# Patient Record
Sex: Male | Born: 1965 | Race: White | Hispanic: No | Marital: Single
Health system: Southern US, Community
[De-identification: ages and names within clinical notes are randomized; demographics above are authoritative.]

## PROBLEM LIST (undated history)

## (undated) DIAGNOSIS — I10 Essential (primary) hypertension: Secondary | ICD-10-CM

## (undated) DIAGNOSIS — F419 Anxiety disorder, unspecified: Secondary | ICD-10-CM

## (undated) DIAGNOSIS — K219 Gastro-esophageal reflux disease without esophagitis: Secondary | ICD-10-CM

## (undated) DIAGNOSIS — J301 Allergic rhinitis due to pollen: Secondary | ICD-10-CM

## (undated) DIAGNOSIS — E785 Hyperlipidemia, unspecified: Secondary | ICD-10-CM

## (undated) DIAGNOSIS — Z9109 Other allergy status, other than to drugs and biological substances: Secondary | ICD-10-CM

## (undated) HISTORY — DX: Allergic rhinitis due to pollen: J30.1

## (undated) HISTORY — DX: Other allergy status, other than to drugs and biological substances: Z91.09

## (undated) HISTORY — DX: Essential (primary) hypertension: I10

## (undated) HISTORY — PX: OTHER SURGICAL HISTORY: SHX169

## (undated) HISTORY — DX: Anxiety disorder, unspecified: F41.9

## (undated) HISTORY — DX: Gastro-esophageal reflux disease without esophagitis: K21.9

## (undated) HISTORY — DX: Hyperlipidemia, unspecified: E78.5

---

## 2014-10-03 ENCOUNTER — Ambulatory Visit: Payer: Self-pay | Admitting: Physician Assistant

## 2014-10-03 DIAGNOSIS — Z0289 Encounter for other administrative examinations: Secondary | ICD-10-CM

## 2014-10-11 ENCOUNTER — Telehealth: Payer: Self-pay | Admitting: *Deleted

## 2014-10-11 NOTE — Telephone Encounter (Signed)
[facility] JG//CMA

## 2014-10-17 ENCOUNTER — Ambulatory Visit (INDEPENDENT_AMBULATORY_CARE_PROVIDER_SITE_OTHER): Payer: Medicare Other | Admitting: Physician Assistant

## 2014-10-17 ENCOUNTER — Encounter: Payer: Self-pay | Admitting: Physician Assistant

## 2014-10-17 VITALS — BP 142/118 | HR 79 | Temp 98.0°F | Resp 16 | Ht 67.0 in | Wt 242.0 lb

## 2014-10-17 DIAGNOSIS — K2100 Gastro-esophageal reflux disease with esophagitis, without bleeding: Secondary | ICD-10-CM

## 2014-10-17 DIAGNOSIS — IMO0001 Reserved for inherently not codable concepts without codable children: Secondary | ICD-10-CM

## 2014-10-17 DIAGNOSIS — R030 Elevated blood-pressure reading, without diagnosis of hypertension: Secondary | ICD-10-CM

## 2014-10-17 DIAGNOSIS — E785 Hyperlipidemia, unspecified: Secondary | ICD-10-CM

## 2014-10-17 DIAGNOSIS — F411 Generalized anxiety disorder: Secondary | ICD-10-CM

## 2014-10-17 MED ORDER — ESOMEPRAZOLE MAGNESIUM 20 MG PO CPDR: 20.0000 mg | DELAYED_RELEASE_CAPSULE | Freq: Every day | ORAL | Status: DC

## 2014-10-17 MED ORDER — ATORVASTATIN CALCIUM 20 MG PO TABS: 20.0000 mg | ORAL_TABLET | Freq: Every day | ORAL | Status: DC

## 2014-10-17 NOTE — Progress Notes (Signed)
Patient presents to clinic today to establish care.  Acute Concerns: No acute concerns at today's visit.  Chronic Issues: Hyperlipidemia -- Patient currently on Lipitor 20 mg.  Denies myalgias/arthralgias.  Is trying to watch his diet and exercise more.  GERD -- Good control on current regimen.  Will need refill of medication.   Anxiety/Depression -- Currently controlled with Combination of Xanax and Citalopram.  Denies depressed mood on anxiety.   Health Maintenance: Dental -- up-to-date Vision -- up-to-date Immunizations -- Allergic to Influenza; Tetanus in 2013.  Past Medical History  Diagnosis Date  . Anxiety   . HTN (hypertension), benign   . Hyperlipidemia   . GERD (gastroesophageal reflux disease)   . Hay fever   . Environmental allergies     Past Surgical History  Procedure Laterality Date  . Unremarkable      No current outpatient prescriptions on file prior to visit.   No current facility-administered medications on file prior to visit.    Allergies  Allergen Reactions  . Influenza Vaccines Anaphylaxis  . Penicillins     Family History  Problem Relation Age of Onset  . Arthritis Mother     Living  . Hypertension Mother   . Hypertension Father   . Diabetes Father   . Colon cancer Father [id number]    Deceased  . Heart disease Other     Grandparent  . Stroke Other     Grandparent  . Prostate cancer Other     Uncle  . Sudden death Other     Aunt & Uncle    History   Social History  . Marital Status: Single    Spouse Name: N/A    Number of Children: N/A  . Years of Education: N/A   Occupational History  . Not on file.   Social History Main Topics  . Smoking status: Current Every Day Smoker  . Smokeless tobacco: Not on file  . Alcohol Use: Not on file  . Drug Use: Not on file  . Sexual Activity: Not on file   Other Topics Concern  . Not on file   Social History Narrative  . No narrative on file   ROS See HPI.  All other ROS are  negative.  BP 142/118  Pulse 79  Temp(Src) 98 F (36.7 C) (Oral)  Resp 16  Ht 5\' 7"  (1.702 m)  Wt 242 lb (109.77 kg)  BMI 37.89 kg/m2  SpO2 99%  Physical Exam  Vitals reviewed. Constitutional: He is oriented to person, place, and time and well-developed, well-nourished, and in no distress.  HENT:  Head: Normocephalic and atraumatic.  Eyes: Conjunctivae are normal.  Neck: Neck supple. No thyromegaly present.  Cardiovascular: Normal rate, regular rhythm, normal heart sounds and intact distal pulses.   Pulmonary/Chest: Effort normal and breath sounds normal. No respiratory distress. He has no wheezes. He has no rales. He exhibits no tenderness.  Lymphadenopathy:    He has no cervical adenopathy.  Neurological: He is alert and oriented to person, place, and time.  Skin: Skin is warm and dry.  Psychiatric: Affect normal.   Assessment/Plan: Gastroesophageal reflux disease with esophagitis Doing well on current regimen.  Medication refilled.   Hyperlipidemia Continue current regimen for now.  Encouraged increase CV exercise.  Patient to return for CPE with fasting labs.  Will obtain lipid panel and liver function testing at that time.  GAD (generalized anxiety disorder) Instructed patient that 5 mg of Xanax prescribed by previous PCP is greater  than the maximum safe dose.  Will continue Celexa and decrease Xanax to QID dosing.  Will have patient sign CSC and obtain UDS at next visit for CPE.  Elevated BP Asymptomatic.  Patient denies history of HTN.  DASH handout given. Follow-up within 1 month for CPE.  Will recheck BP at that time.

## 2014-10-17 NOTE — Patient Instructions (Addendum)
Please continue medications as directed.  For the anxiety, we will no be prescribing the Xanax more than 4 times daily as your current dose exceeds the maxiumum safe daily dosing.  You will either need to let us add a different medication or set you up with a psychiatrist in the area.  I recommend you schedule an appointment for a Complete Physical.  DASH Eating Plan DASH stands for "Dietary Approaches to Stop Hypertension." The DASH eating plan is a healthy eating plan that has been shown to reduce high blood pressure (hypertension). Additional health benefits may include reducing the risk of type 2 diabetes mellitus, heart disease, and stroke. The DASH eating plan may also help with weight loss. WHAT DO I NEED TO KNOW ABOUT THE DASH EATING PLAN? For the DASH eating plan, you will follow these general guidelines:  Choose foods with a percent daily value for sodium of less than 5% (as listed on the food label).  Use salt-free seasonings or herbs instead of table salt or sea salt.  Check with your health care provider or pharmacist before using salt substitutes.  Eat lower-sodium products, often labeled as "lower sodium" or "no salt added."  Eat fresh foods.  Eat more vegetables, fruits, and low-fat dairy products.  Choose whole grains. Look for the word "whole" as the first word in the ingredient list.  Choose fish and skinless chicken or Mal[place]key more often than red meat. Limit fish, poultry, and meat to 6 oz (170 g) each day.  Limit sweets, desserts, sugars, and sugary drinks.  Choose heart-healthy fats.  Limit cheese to 1 oz (28 g) per day.  Eat more home-cooked food and less restaurant, buffet, and fast food.  Limit fried foods.  Cook foods using methods other than frying.  Limit canned vegetables. If you do use them, rinse them well to decrease the sodium.  When eating at a restaurant, ask that your food be prepared with less salt, or no salt if possible. WHAT FOODS CAN I  EAT? Seek help from a dietitian for individual calorie needs. Grains Whole grain or whole wheat bread. Brown rice. Whole grain or whole wheat pasta. Quinoa, bulgur, and whole grain cereals. Low-sodium cereals. Corn or whole wheat flour tortillas. Whole grain cornbread. Whole grain crackers. Low-sodium crackers. Vegetables Fresh or frozen vegetables (raw, steamed, roasted, or grilled). Low-sodium or reduced-sodium tomato and vegetable juices. Low-sodium or reduced-sodium tomato sauce and paste. Low-sodium or reduced-sodium canned vegetables.  Fruits All fresh, canned (in natural juice), or frozen fruits. Meat and Other Protein Products Ground beef (85% or leaner), grass-fed beef, or beef trimmed of fat. Skinless chicken or [place]key. Ground chicken or [place]urkey. Pork trimmed of fat. All fish and seafood. Eggs. Dried beans, peas, or lentils. Unsalted nuts and seeds. Unsalted canned beans. Dairy Low-fat dairy products, such as skim or 1% milk, 2% or reduced-fat cheeses, low-fat ricotta or cottage cheese, or plain low-fat yogurt. Low-sodium or reduced-sodium cheeses. Fats and Oils Tub margarines without trans fats. Light or reduced-fat mayonnaise and salad dressings (reduced sodium). Avocado. Safflower, olive, or canola oils. Natural peanut or almond butter. Other Unsalted popcorn and pretzels. The items listed above may not be a complete list of recommended foods or beverages. Contact your dietitian for more options. WHAT FOODS ARE NOT RECOMMENDED? Grains White bread. White pasta. White rice. Refined cornbread. Bagels and croissants. Crackers that contain trans fat. Vegetables Creamed or fried vegetables. Vegetables in a cheese sauce. Regular canned vegetables. Regular canned tomato sauce and paste.  Regular tomato and vegetable juices. Fruits Dried fruits. Canned fruit in light or heavy syrup. Fruit juice. Meat and Other Protein Products Fatty cuts of meat. Ribs, chicken wings, bacon, sausage,  bologna, salami, chitterlings, fatback, hot dogs, bratwurst, and packaged luncheon meats. Salted nuts and seeds. Canned beans with salt. Dairy Whole or 2% milk, cream, half-and-half, and cream cheese. Whole-fat or sweetened yogurt. Full-fat cheeses or blue cheese. Nondairy creamers and whipped toppings. Processed cheese, cheese spreads, or cheese curds. Condiments Onion and garlic salt, seasoned salt, table salt, and sea salt. Canned and packaged gravies. Worcestershire sauce. Tartar sauce. Barbecue sauce. Teriyaki sauce. Soy sauce, including reduced sodium. Steak sauce. Fish sauce. Oyster sauce. Cocktail sauce. Horseradish. Ketchup and mustard. Meat flavorings and tenderizers. Bouillon cubes. Hot sauce. Tabasco sauce. Marinades. Taco seasonings. Relishes. Fats and Oils Butter, stick margarine, lard, shortening, ghee, and bacon fat. Coconut, palm kernel, or palm oils. Regular salad dressings. Other Pickles and olives. Salted popcorn and pretzels. The items listed above may not be a complete list of foods and beverages to avoid. Contact your dietitian for more information. WHERE CAN I FIND MORE INFORMATION? National Heart, Lung, and Blood Institute: CablePromo.itwww.nhlbi.nih.gov/health/health-topics/topics/dash/ Document Released: 11/28/2011 Document Revised: 04/25/2014 Document Reviewed: 10/13/2013 [facility] Patient Information 2015 [place] [place]. This information is not intended to replace advice given to you by your health care provider. Make sure you discuss any questions you have with your health care provider.

## 2014-10-17 NOTE — Progress Notes (Signed)
Pre visit review using our clinic review tool, if applicable. No additional management support is needed unless otherwise documented below in the visit note/SLS  

## 2014-10-18 ENCOUNTER — Telehealth: Payer: Self-pay | Admitting: Physician Assistant

## 2014-10-18 DIAGNOSIS — R030 Elevated blood-pressure reading, without diagnosis of hypertension: Secondary | ICD-10-CM | POA: Insufficient documentation

## 2014-10-18 DIAGNOSIS — F411 Generalized anxiety disorder: Secondary | ICD-10-CM | POA: Insufficient documentation

## 2014-10-18 DIAGNOSIS — E785 Hyperlipidemia, unspecified: Secondary | ICD-10-CM | POA: Insufficient documentation

## 2014-10-18 DIAGNOSIS — K2100 Gastro-esophageal reflux disease with esophagitis, without bleeding: Secondary | ICD-10-CM | POA: Insufficient documentation

## 2014-10-18 NOTE — Assessment & Plan Note (Signed)
Instructed patient that 5 mg of Xanax prescribed by previous PCP is greater than the maximum safe dose.  Will continue Celexa and decrease Xanax to QID dosing.  Will have patient sign CSC and obtain UDS at next visit for CPE.

## 2014-10-18 NOTE — Assessment & Plan Note (Signed)
Doing well on current regimen.  Medication refilled.

## 2014-10-18 NOTE — Assessment & Plan Note (Signed)
Continue current regimen for now.  Encouraged increase CV exercise.  Patient to return for CPE with fasting labs.  Will obtain lipid panel and liver function testing at that time.

## 2014-10-18 NOTE — Telephone Encounter (Signed)
emmi mailed  °

## 2014-10-18 NOTE — Assessment & Plan Note (Signed)
Asymptomatic.  Patient denies history of HTN.  DASH handout given. Follow-up within 1 month for CPE.  Will recheck BP at that time.

## 2014-10-27 ENCOUNTER — Telehealth: Payer: Self-pay | Admitting: *Deleted

## 2014-10-27 NOTE — Telephone Encounter (Signed)
[facility] JG//CMA

## 2014-10-31 ENCOUNTER — Other Ambulatory Visit: Payer: Self-pay | Admitting: Physician Assistant

## 2014-10-31 NOTE — Telephone Encounter (Signed)
Caller name: [name] Relation to pt: self Call back number:  [phone] Pharmacy:  [facility] [address]  Reason for call:   Patient requesting another refill of citalopram. Please call patient, he has additional questions regarding this medication.

## 2014-11-01 MED ORDER — PANTOPRAZOLE SODIUM 20 MG PO TBEC: 20.0000 mg | DELAYED_RELEASE_TABLET | Freq: Every day | ORAL | Status: DC

## 2014-11-01 MED ORDER — CITALOPRAM HYDROBROMIDE 40 MG PO TABS: 20.0000 mg | ORAL_TABLET | Freq: Two times a day (BID) | ORAL | Status: AC

## 2014-11-01 NOTE — Telephone Encounter (Signed)
LMOM with contact name and number for return call RE: questions concerning medication; also, to inform that change had to be made with Omeprazole to Pantoprazole d/t possible Cardiac interaction with provider instructions/SLS

## 2014-11-01 NOTE — Telephone Encounter (Signed)
Pt returned call

## 2014-11-09 ENCOUNTER — Telehealth: Payer: Self-pay

## 2014-11-09 ENCOUNTER — Telehealth: Payer: Self-pay | Admitting: Physician Assistant

## 2014-11-09 DIAGNOSIS — E785 Hyperlipidemia, unspecified: Secondary | ICD-10-CM

## 2014-11-09 MED ORDER — ATORVASTATIN CALCIUM 20 MG PO TABS: 20.0000 mg | ORAL_TABLET | Freq: Every day | ORAL | Status: AC

## 2014-11-09 MED ORDER — ALPRAZOLAM 1 MG PO TABS: 1.0000 mg | ORAL_TABLET | Freq: Four times a day (QID) | ORAL | Status: DC | PRN

## 2014-11-09 NOTE — Telephone Encounter (Signed)
rx faxed to pts pharmacy . Pt scheduled for follow-up 11/11/14

## 2014-11-09 NOTE — Telephone Encounter (Signed)
See open phone note  

## 2014-11-09 NOTE — Telephone Encounter (Signed)
rx refill- Xanax 1mg   Last OV- 10/17/14 Last refilled- historical provider  Last UDS- none

## 2014-11-09 NOTE — Telephone Encounter (Signed)
No Xanx until follow-up and UDS.

## 2014-11-09 NOTE — Telephone Encounter (Signed)
Caller name: deborah Relation to pt: sister Call back number: [phone] [facility] [address]  Reason for call:   Patient sister wanting a refill of xanax to be sent to pharmacy. Patient sister states that they will be trying to find a new provider through social services but need refill to until he see's someone else  Patient also states that he needs a refill of nexium

## 2014-11-09 NOTE — Telephone Encounter (Signed)
Patient would like to know if his Xanax can be filled without him returning to the office for his recommended CPE. Advised that his appt was to establish care and that he would need the physical and labs to continue receiving his medications. States that he cannot afford a visit due to his medicaid not paying at this time. Patient wanted to insure that he would be able to get his Xanax when he needed it on the 26th. The UDS and Contract are not yet in EMR.  Please advise your recommendations.

## 2014-11-09 NOTE — Telephone Encounter (Signed)
Caller name:Cimino, Dola ArgyleJames R Relation to VW:UJWJpt:self  Call back number: [phone] [phone]  Reason for call:  Pt requesting a refill ALPRAZolam (XANAX) 1 MG tablet & atorvastatin (LIPITOR) 20 MG tablet.

## 2014-11-09 NOTE — Telephone Encounter (Signed)
Patient was instructed to follow-up so we could reassess.  He was also supposed to have a UDS and sign a CSC at that visit.  No full refill will be given without follow-up, CSC signature and UDS.  I will send in a limited supply of medication for him to take until he returns to clinic.  Patient switched from Nexium to Protonix due to adverse reaction between Nexium and Celexa.

## 2014-11-10 ENCOUNTER — Telehealth: Payer: Self-pay | Admitting: Physician Assistant

## 2014-11-10 NOTE — Telephone Encounter (Signed)
Spoke with pts sister. Done.

## 2014-11-10 NOTE — Telephone Encounter (Signed)
Caller name:Bartle, Dola ArgyleJames R Relation to ZO:XWRUpt:self  Call back [phone]   Reason for call:   Pt wanted to inform you he would like to continue taking the Nexium

## 2014-11-10 NOTE — Telephone Encounter (Signed)
Done . pts sister notified

## 2014-11-11 ENCOUNTER — Encounter: Payer: Self-pay | Admitting: Physician Assistant

## 2014-11-11 ENCOUNTER — Ambulatory Visit (INDEPENDENT_AMBULATORY_CARE_PROVIDER_SITE_OTHER): Payer: Medicare Other | Admitting: Physician Assistant

## 2014-11-11 VITALS — BP 136/86 | HR 77 | Temp 98.1°F | Resp 16

## 2014-11-11 DIAGNOSIS — F411 Generalized anxiety disorder: Secondary | ICD-10-CM

## 2014-11-11 NOTE — Telephone Encounter (Signed)
Will discuss at his visit

## 2014-11-11 NOTE — Progress Notes (Signed)
Pre visit review using our clinic review tool, if applicable. No additional management support is needed unless otherwise documented below in the visit note. 

## 2014-11-14 ENCOUNTER — Encounter: Payer: Medicare Other | Admitting: Physician Assistant

## 2014-11-14 ENCOUNTER — Other Ambulatory Visit: Payer: Self-pay | Admitting: Physician Assistant

## 2014-11-14 NOTE — Telephone Encounter (Signed)
Caller name: Trevor Norris Relation to pt: sister Call back number: [phone] or [phone] Pharmacy:   Reason for call:   Trevor Belfasteborah whitley, patient sister [facility]. She states that he only received partial rx???

## 2014-11-14 NOTE — Assessment & Plan Note (Signed)
Doing well with TID Xanax and continuing Citalopram 40 mg daily. Nexium discontinued due to adverse reactions with high dose of Citalopram.  Rx Protonix instead.  CSC signed and UDS obtained today.  Follow-up will be based on UDS results.

## 2014-11-14 NOTE — Telephone Encounter (Signed)
He only received a partial prescription (60 tablets) because he was overdue for submitting a UDS and signing a controlled substance contract, things that were discussed with both the patient's sister and patient several times, including at his visit last week.  Patient gave UDS at visit on Friday so once I have the results, he can receive a full Rx.  I suggest the patient's sister communicate more with the patient and vice versa so there is not confusion about the plan.

## 2014-11-14 NOTE — Progress Notes (Signed)
Patient presents to clinic today to discuss medications for anxiety.  Patient had requested refill of Xanax previously given by last PMD.  Patient was given partial fill of medication with new dosing instructions (will note write Xanax for 5 times daily).  Patient is here to sign his CSC and provide a sample for a urine drug screen so that he can be eligible to receive future refills.  Patient wishes to discuss Xanax dosing further.  Previously was very distraught once he learned we would not be allowing five times daily dosing of his 1 mg Xanax tablets. Patient states he has actually been doing very well on Xanax up to 3 times daily.  Denies panic attacks.  Is trying to stay active to help relieve stress and prevent anxiety.  Past Medical History  Diagnosis Date  . Anxiety   . HTN (hypertension), benign   . Hyperlipidemia   . GERD (gastroesophageal reflux disease)   . Hay fever   . Environmental allergies     Current Outpatient Prescriptions on File Prior to Visit  Medication Sig Dispense Refill  . acetaminophen (TYLENOL) 500 MG tablet Take 1,000 mg by mouth every 6 (six) hours as needed.    . ALPRAZolam (XANAX) 1 MG tablet Take 1 tablet (1 mg total) by mouth 4 (four) times daily as needed for anxiety. 60 tablet 0  . atorvastatin (LIPITOR) 20 MG tablet Take 1 tablet (20 mg total) by mouth daily. 30 tablet 6  . citalopram (CELEXA) 40 MG tablet Take 0.5 tablets (20 mg total) by mouth 2 (two) times daily. 60 tablet 1  . diphenhydrAMINE (BENADRYL) 50 MG capsule Take 50 mg by mouth every 6 (six) hours as needed (Takres 3-5 capsules daily).    . mometasone (NASONEX) 50 MCG/ACT nasal spray Place 2 sprays into the nose daily.    . pantoprazole (PROTONIX) 20 MG tablet Take 1 tablet (20 mg total) by mouth daily. 30 tablet 1  . simethicone (MYLICON) 80 MG chewable tablet Chew 80 mg by mouth 3 (three) times daily as needed for flatulence.     No current facility-administered medications on file  prior to visit.    Allergies  Allergen Reactions  . Influenza Vaccines Anaphylaxis  . Penicillins     Family History  Problem Relation Age of Onset  . Arthritis Mother     Living  . Hypertension Mother   . Hypertension Father   . Diabetes Father   . Colon cancer Father [id number]    Deceased  . Heart disease Other     Grandparent  . Stroke Other     Grandparent  . Prostate cancer Other     Uncle  . Sudden death Other     Aunt & Uncle    History   Social History  . Marital Status: Single    Spouse Name: N/A    Number of Children: N/A  . Years of Education: N/A   Social History Main Topics  . Smoking status: Current Every Day Smoker  . Smokeless tobacco: None  . Alcohol Use: None  . Drug Use: None  . Sexual Activity: None   Other Topics Concern  . None   Social History Narrative   Review of Systems - See HPI.  All other ROS are negative.  BP 136/86 mmHg  Pulse 77  Temp(Src) 98.1 F (36.7 C) (Oral)  Resp 16  Wt   SpO2 97%  Physical Exam  Constitutional: He is oriented to person, place,  and time and well-developed, well-nourished, and in no distress.  HENT:  Head: Normocephalic and atraumatic.  Cardiovascular: Normal rate, regular rhythm and normal heart sounds.   Pulmonary/Chest: Breath sounds normal.  Neurological: He is alert and oriented to person, place, and time.  Psychiatric: Affect normal.  Vitals reviewed.  Assessment/Plan: GAD (generalized anxiety disorder) Doing well with TID Xanax and continuing Citalopram 40 mg daily. Nexium discontinued due to adverse reactions with high dose of Citalopram.  Rx Protonix instead.  CSC signed and UDS obtained today.  Follow-up will be based on UDS results.

## 2014-11-15 ENCOUNTER — Telehealth: Payer: Self-pay | Admitting: Physician Assistant

## 2014-11-15 NOTE — Telephone Encounter (Signed)
Pt wanted to touch base in regards to previous conversation regarding medication and would like to apologize for the way he spoke to you.

## 2014-11-15 NOTE — Telephone Encounter (Signed)
Martin's note was reviewed with sister.  Sister stated understanding and stated that she was would review this information with patient.  No further needs voiced at this time.

## 2014-11-15 NOTE — Telephone Encounter (Signed)
Trevor MerlWilliam C Martin, PA-C at 11/14/2014 4:39 PM     Status: Signed       Expand All Collapse All   He only received a partial prescription (60 tablets) because he was overdue for submitting a UDS and signing a controlled substance contract, things that were discussed with both the patient's sister and patient several times, including at his visit last week. Patient gave UDS at visit on Friday so once I have the results, he can receive a full Rx. I suggest the patient's sister communicate more with the patient and vice versa so there is not confusion about the plan.     The above information was discussed with patient.  Pt stated understanding.  No further needs at this time.

## 2014-11-16 ENCOUNTER — Telehealth: Payer: Self-pay | Admitting: *Deleted

## 2014-11-16 NOTE — Telephone Encounter (Signed)
[facility] JG//CMA

## 2014-11-22 NOTE — Telephone Encounter (Signed)
Patient No Show F/U appt, did not complete mandatory UDS as requested; No refill authorizations will be given per provider/SLS

## 2014-11-23 ENCOUNTER — Telehealth: Payer: Self-pay | Admitting: Physician Assistant

## 2014-11-23 MED ORDER — ALPRAZOLAM 1 MG PO TABS: 1.0000 mg | ORAL_TABLET | Freq: Four times a day (QID) | ORAL | Status: DC | PRN

## 2014-11-23 NOTE — Telephone Encounter (Signed)
Refill granted -- again limited supply.  There will be no more given until UDS is obtained.  He needs to come to lab for UDS.

## 2014-11-23 NOTE — Telephone Encounter (Signed)
Spoke with patient regarding the needed urine sample for UDS screening, in order for us to fulfill his request for complete Rx for his controlled substance. Pt was very accusatory of our employees, stating that "a 48-yr old could do the job and what did we do with his urine sample--throw them out the window" [speaking of our lab staff]; patient was informed that we had not only checked with our lab staff, but also the facility that runs our UDS labs and that neither had ever received a urine sample from this patient [as he states that he gave urine sample at his new patient appt and came back in at another time after we had informed him that not only did we need [facility] signed but he still had to give us a urine sample for mandatory UDS [and at that time nothing was mentioned regarding the sample being and/o not being done at the time of OV]. Pt continued to state that "it is not his responsibility to make sure that the lab receives his urine sample, only his responsibility to leave it"; pt was corrected on this matter and told that it is also his responsibility and that he needed to confirm when he returns that the lab staff receives the urine sample before leaving the office, as per protocol, we will not be able to authorize any further refill authorizations without those results. Spoke with pharmacy regarding patient's Rx, verified they received fax; but was informed that it was too soon to fill the Rx. Provider checked [place] database and found that patient filled Rx written from prior physician and that he is not due for refill until 12.09.15; LMOVM for patient to inform him of this and that provider has given him a supply of medication that has been faxed & received by pharmacy and when it will be available for fill/SLS

## 2014-11-23 NOTE — Telephone Encounter (Signed)
Spoke with patient's sister concerning situation as [facility]  She informs understanding.  Will speak with her brother to have him come give UDS.  I do think the patient is just having a hard time with instructions giving his Autism.  Lab has been made aware of patient's "needs" and will make sure UDS is obtained when he returns to office.  They are to inform me when he has come in for this.

## 2014-11-23 NOTE — Telephone Encounter (Signed)
Per our Lab, patient has NOT gave a urine sample for UDS/SLS Please Advise.

## 2014-11-23 NOTE — Telephone Encounter (Signed)
Caller name: [name] Relation to pt: self Call back number: [phone] Pharmacy:  Reason for call:   Patient is requesting results from urine specimen? So, he can get the other half of the medication

## 2014-11-23 NOTE — Telephone Encounter (Signed)
Patient again needs to come give a urine sample for a drug screen.  This has been iterated several times to the patient and his sister.  No additional refills until he gives sample. Please inform patient and sister so she can help him remember to come give sample.

## 2014-11-24 ENCOUNTER — Telehealth: Payer: Self-pay

## 2014-11-24 NOTE — Telephone Encounter (Signed)
Thank you for making me aware of his appointment.  I will mark is in my reminders so that I can speak to him.

## 2014-11-24 NOTE — Telephone Encounter (Signed)
Annamaria HellingJames Schoof [phone] [facility](M) [phone] [facility](H)  [name] called to say he is coming in on Monday at 1:15 to do the urine drug screen for the third time and he stated he was going to put speciman in your hand. I told him the best thing to do is give to them in the lab.

## 2014-11-28 ENCOUNTER — Other Ambulatory Visit: Payer: Medicare Other

## 2014-11-29 ENCOUNTER — Telehealth: Payer: Self-pay | Admitting: Physician Assistant

## 2014-11-29 ENCOUNTER — Other Ambulatory Visit: Payer: Self-pay | Admitting: Physician Assistant

## 2014-11-29 ENCOUNTER — Encounter: Payer: Self-pay | Admitting: Physician Assistant

## 2014-11-29 NOTE — Telephone Encounter (Signed)
Attempted to reach patient and his [facility]  Could not reach either. LMOM (sister) to let he know patient no-showed for lab appointment to give UDS.

## 2014-11-29 NOTE — Telephone Encounter (Signed)
Patient came in to office today to give UDS.  UDS sample obtained and note written as proof of patient's visit.

## 2014-12-05 ENCOUNTER — Other Ambulatory Visit: Payer: Self-pay | Admitting: Physician Assistant

## 2014-12-05 NOTE — Telephone Encounter (Addendum)
Medication   esomeprazole (NEXIUM) 20 MG capsule [[id number]]       esomeprazole (NEXIUM) 20 MG capsule [[id number]][[id number]] DISCONTINUED     Order Details    Dose: 20 mg Route: Oral Frequency: Daily   Dispense Quantity:  30 capsule Refills:  0 Fills Remaining:  0          Sig: Take 1 capsule (20 mg total) by mouth daily at 12 noon.         Discontinue Date:  11/01/2014 [id number] Discontinue User:  Regis BillSharon L Camerin Jimenez, CMA Discontinue Reason:  Side effect (s)   Written Date:  10/17/14 Expiration Date:  10/17/15     Start Date:  10/17/14 End Date:  11/01/14     Ordering Provider:  -- Authorizing Provider:  Waldon MerlWilliam C Martin, PA-C Ordering User:  Waldon MerlWilliam C Martin, PA-C          Medication Notes      Regis BillSharon L Janete Quilling, CMA -- 11/01/14 9:58 AM      Possible Drug Interaction, change per VO provider/SLS      Diagnosis Association: Gastroesophageal reflux disease with esophagitis (530.11)              Medication Detail      Disp Refills Start End     ALPRAZolam (XANAX) 1 MG tablet 60 tablet 0 11/23/2014     Sig - Route: Take 1 tablet (1 mg total) by mouth 4 (four) times daily as needed for anxiety. - Oral    Class: Print    Notes to Pharmacy: Limites supply given. No refills without appointment and urine drug screen    Hold for Urine Drug Screening Results per provider/SLS

## 2014-12-08 ENCOUNTER — Other Ambulatory Visit: Payer: Self-pay

## 2014-12-08 ENCOUNTER — Telehealth: Payer: Self-pay | Admitting: Physician Assistant

## 2014-12-08 ENCOUNTER — Other Ambulatory Visit: Payer: Self-pay | Admitting: Physician Assistant

## 2014-12-08 MED ORDER — PANTOPRAZOLE SODIUM 20 MG PO TBEC: 20.0000 mg | DELAYED_RELEASE_TABLET | Freq: Every day | ORAL | Status: DC

## 2014-12-08 NOTE — Telephone Encounter (Signed)
Phoned in Alprazolam rx to pharmacy #120 0 refills. LMOM informing Pt he should NOT be taking Nexium due to reactions with Celexa, he should be taking Protonix. Also informed him urine results were not back as of yet and will give him a call as soon as they do.

## 2014-12-08 NOTE — Telephone Encounter (Signed)
Please advise 

## 2014-12-08 NOTE — Telephone Encounter (Signed)
Caller name: [name] Relation to pt: self Call back number: [phone] Pharmacy[facility] [address]  Reason for call:   Patient is requesting urinalysis results.   Patient is also requesting a nexium and alprazolam(another 15 days?) refill  Please call patient after lunch.

## 2014-12-08 NOTE — Telephone Encounter (Signed)
Med filled.  

## 2014-12-08 NOTE — Telephone Encounter (Signed)
Cannot have Nexium and discussed several times because at his dose of Celexa, there can be problems mixing it with Nexium.  He was switched to Protonix.  Ok to refill that prescription.  Still waiting on urine results but I am ok giving him an entire month supply of Xanax while we are waiting.  He takes 1 tablet up to QID.  Phone in Rx with Quantity 120.

## 2014-12-09 ENCOUNTER — Telehealth: Payer: Self-pay | Admitting: Physician Assistant

## 2014-12-09 MED ORDER — LANSOPRAZOLE 15 MG PO TBDP: 15.0000 mg | ORAL_TABLET | Freq: Every day | ORAL | Status: DC

## 2014-12-09 NOTE — Telephone Encounter (Signed)
Med filled.  

## 2014-12-09 NOTE — Telephone Encounter (Signed)
Please can you complete the PA for Prevacid or Protonix.

## 2014-12-09 NOTE — Telephone Encounter (Signed)
Caller name: London SheerChimber, Grae R Relation to pt: self  Call back number: [phone] Pharmacy:  Reason for call:  Pt states insurance will not cover pantoprazole (PROTONIX) 20 MG tablet, pt requesting esomeprazole (NEXIUM) 20 MG capsule in addition pt requesting UA results. Please advise

## 2014-12-09 NOTE — Telephone Encounter (Signed)
Again, UDS looks great.  No concerns.  Will not need repeat for 1 year.  Again, will not give Nexium.  Please call pharmacy to see cost of Nexium. Can attempt Prevacid if  Nexium costly.

## 2014-12-09 NOTE — Telephone Encounter (Signed)
Prilosec carries the same risk associated as Nexium.  Recommend we try to get a PA for the Protonix or we can send in an Rx for Zantac to take twice daily as insurance will likely cover this.

## 2014-12-09 NOTE — Telephone Encounter (Signed)
Spoke with pt pharmacy and they advised that the prevacid is not covered. However they stated that prilosec may be. They would not run it to determine the cost for me.

## 2014-12-09 NOTE — Telephone Encounter (Signed)
Last OV 11-11-14 Alprazolam last filled 11-23-14 #60 with 0  Note says UDS needed.

## 2014-12-09 NOTE — Telephone Encounter (Signed)
UDS results are in and everything looks like it is supposed to.  Will repeat UDS yearly.  Medication was phoned into pharmacy yesterday -- entire month supply.

## 2014-12-09 NOTE — Telephone Encounter (Signed)
Pt.notified

## 2014-12-13 MED ORDER — DEXLANSOPRAZOLE 30 MG PO CPDR: 30.0000 mg | DELAYED_RELEASE_CAPSULE | Freq: Every day | ORAL | Status: DC

## 2014-12-13 NOTE — Addendum Note (Signed)
Addended by: Amado CoeGLOVER, Kristy Catoe A on: 12/13/2014 02:03 PM   Modules accepted: Orders, Medications

## 2014-12-13 NOTE — Telephone Encounter (Signed)
PA denied, alternative is Dexilant. Would this be an acceptable alternative to switch to? Please advise.

## 2014-12-13 NOTE — Telephone Encounter (Signed)
Prevacid solutab discontinued and dexilant 30 mg sent to pharmacy. JG//CMA

## 2014-12-13 NOTE — Telephone Encounter (Signed)
PA initiated. Awaiting determination. JG//CMA 

## 2014-12-13 NOTE — Telephone Encounter (Signed)
Yes, please switch to Dexilant daily.

## 2014-12-14 ENCOUNTER — Telehealth: Payer: Self-pay | Admitting: Physician Assistant

## 2014-12-14 NOTE — Telephone Encounter (Signed)
Patient called in stating that he is very upset that [facility] [address]. Patient gave me the verbal ok to send all of his records. P: [phone] and F: [phone]: [phone]

## 2014-12-14 NOTE — Telephone Encounter (Signed)
Medical record request was received on 11/16/2014 and sent [facility].  Requested medical records faxed to number provided successfully.  JG//CMA

## 2014-12-27 ENCOUNTER — Encounter: Payer: Self-pay | Admitting: Physician Assistant

## 2014-12-29 ENCOUNTER — Telehealth: Payer: Self-pay | Admitting: Physician Assistant

## 2014-12-29 NOTE — Telephone Encounter (Signed)
Caller name: [name] Relation to pt: self Call back number:  [phone] Pharmacy:  Reason for call:   Patient states that [facility] told him that they "could not meet his needs" and would not accept him as a new patient. Patient states that he still wants to stay here as a patient.

## 2015-01-02 MED ORDER — ALPRAZOLAM 1 MG PO TABS: ORAL_TABLET | ORAL | Status: DC

## 2015-01-02 NOTE — Addendum Note (Signed)
Addended by: Regis BillSCATES, Lindzy Rupert L on: 01/02/2015 02:21 PM   Modules accepted: Orders

## 2015-01-02 NOTE — Telephone Encounter (Signed)
Rx request printed for provider signature/SLS

## 2015-01-02 NOTE — Telephone Encounter (Signed)
Will do.  Ok to send in refill of Xanax with same quantity and instructions if due for medication.

## 2015-01-02 NOTE — Telephone Encounter (Signed)
Patient returned phone. I informed him of this and he would like to know if Selena BattenCody would give him one refill to last him until he can find another provider?  Best # [phone] sister, Gavin PoundDeborah

## 2015-01-02 NOTE — Telephone Encounter (Signed)
LMOM with contact name and number [for return call, if needed] RE: Unfortunately patient had to be switched to Medicaid, per his Sister's conversation with Marcelline MatesWilliam Martin, PA-C and we do not accept [place] Access Medicaid via [place], informed pt of this via telephone per provider instructions/SLS

## 2015-01-02 NOTE — Telephone Encounter (Signed)
Rx request faxed to pharmacy; pt can fill on or after 01.18.16 per provider/SLS

## 2015-01-05 NOTE — Telephone Encounter (Signed)
Patient requesting a callback late morning.

## 2015-01-05 NOTE — Telephone Encounter (Signed)
Patient would like to know if Selena BattenCody would refill another month after this past refill. Just in case he cannot find another provider within a month. Best # [phone]

## 2015-01-06 ENCOUNTER — Telehealth: Payer: Self-pay | Admitting: Physician Assistant

## 2015-01-06 NOTE — Telephone Encounter (Signed)
Pt called to follow up on he's medication request. Advised pt of PA instructions, pt voice understanding.Patient stated it was kind of PA to refill he's medication till Feb and if needed for 1 month if PCP is not found.

## 2015-01-06 NOTE — Telephone Encounter (Signed)
Pt called to follow up on he's medication request. Advised pt of PA instructions, pt voice understanding.Patient stated it was kind of PA to refill he's medication till Feb if needed for 1 month if PCP is not found.

## 2015-01-06 NOTE — Telephone Encounter (Signed)
He should be good on medication until February 18th. If not provider by that time I am willing to write one more refill but no other refills will be given.

## 2020-03-30 ENCOUNTER — Other Ambulatory Visit: Payer: Self-pay

## 2020-03-30 ENCOUNTER — Emergency Department (HOSPITAL_COMMUNITY): Payer: Medicare HMO

## 2020-03-30 ENCOUNTER — Telehealth (HOSPITAL_COMMUNITY): Payer: Self-pay | Admitting: Nurse Practitioner

## 2020-03-30 ENCOUNTER — Emergency Department (HOSPITAL_COMMUNITY)
Admission: EM | Admit: 2020-03-30 | Discharge: 2020-03-30 | Disposition: A | Discharge status: Home / Self Care | Payer: Medicare HMO | Attending: Emergency Medicine | Admitting: Emergency Medicine

## 2020-03-30 DIAGNOSIS — U071 COVID-19: Secondary | ICD-10-CM

## 2020-03-30 DIAGNOSIS — Z79899 Other long term (current) drug therapy: Secondary | ICD-10-CM | POA: Diagnosis not present

## 2020-03-30 DIAGNOSIS — R509 Fever, unspecified: Secondary | ICD-10-CM | POA: Diagnosis present

## 2020-03-30 DIAGNOSIS — F419 Anxiety disorder, unspecified: Secondary | ICD-10-CM | POA: Insufficient documentation

## 2020-03-30 DIAGNOSIS — I10 Essential (primary) hypertension: Secondary | ICD-10-CM | POA: Insufficient documentation

## 2020-03-30 DIAGNOSIS — F17210 Nicotine dependence, cigarettes, uncomplicated: Secondary | ICD-10-CM | POA: Insufficient documentation

## 2020-03-30 LAB — POC SARS CORONAVIRUS 2 AG -  ED: SARS Coronavirus 2 Ag: POSITIVE — AB

## 2020-03-30 MED ORDER — DIPHENHYDRAMINE HCL 25 MG PO TABS: 25.0000 mg | ORAL_TABLET | Freq: Three times a day (TID) | ORAL | 0 refills | Status: AC | PRN

## 2020-03-30 MED ORDER — ACETAMINOPHEN 325 MG PO TABS
650.0000 mg | ORAL_TABLET | Freq: Once | ORAL | Status: AC
  Administered 2020-03-30: 10:00:00 650 mg via ORAL
  Filled 2020-03-30: qty 2

## 2020-03-30 MED ORDER — CLONAZEPAM 0.5 MG PO TABS
1.0000 mg | ORAL_TABLET | Freq: Once | ORAL | Status: AC
  Administered 2020-03-30: 1 mg via ORAL
  Filled 2020-03-30: qty 2

## 2020-03-30 NOTE — ED Provider Notes (Signed)
Trevor Norris Provider Note   CSN: [id number] Arrival date & time: 03/30/20  0850     History Chief Complaint  Patient presents with  . Anxiety  . Fever    Trevor Norris is a 54 y.o. male.  HPI Patient presents with fever cough nasal congestion and anxiety.  States he thinks he cough could be coming from the nasal drainage going down his throat.  No known sick contacts.  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  States he wears a mask and rarely goes out.  No loss of taste or smell.  No vomiting diarrhea.  Has occasional nausea also from the drainage down his throat.  No real sore throat.  Patient has a history of anxiety and states he is very anxious this time.    Past Medical History:  Diagnosis Date  . Anxiety   . Environmental allergies   . GERD (gastroesophageal reflux disease)   . Hay fever   . HTN (hypertension), benign   . Hyperlipidemia     Patient Active Problem List   Diagnosis Date Noted  . Hyperlipidemia 10/18/2014  . Gastroesophageal reflux disease with esophagitis 10/18/2014  . GAD (generalized anxiety disorder) 10/18/2014  . Elevated BP 10/18/2014    Past Surgical History:  Procedure Laterality Date  . Unremarkable         Family History  Problem Relation Age of Onset  . Arthritis Mother        Living  . Hypertension Mother   . Hypertension Father   . Diabetes Father   . Colon cancer Father 70       Deceased  . Heart disease Other        Grandparent  . Stroke Other        Grandparent  . Prostate cancer Other        Uncle  . Sudden death Other        Aunt & Uncle    Social History   Tobacco Use  . Smoking status: Current Every Day Smoker  Substance Use Topics  . Alcohol use: Not on file  . Drug use: Not on file    Home Medications Prior to Admission medications   Medication Sig Start Date End Date Taking? Authorizing Provider  acetaminophen (TYLENOL) 500 MG  tablet Take 1,000 mg by mouth every 6 (six) hours as needed.    [provider]  ALPRAZolam Prudy Feeler) 1 MG tablet TAKE 1 TABLET BY MOUTH 4 TIMES A DAY AS NEEDED FOR ANXIETY 01/02/15   Waldon Merl, PA-C  atorvastatin (LIPITOR) 20 MG tablet Take 1 tablet (20 mg total) by mouth daily. 11/09/14   Waldon Merl, PA-C  citalopram (CELEXA) 40 MG tablet Take 0.5 tablets (20 mg total) by mouth 2 (two) times daily. 11/01/14   Waldon Merl, PA-C  Dexlansoprazole (DEXILANT) 30 MG capsule Take 1 capsule (30 mg total) by mouth daily. 12/13/14   Waldon Merl, PA-C  diphenhydrAMINE (BENADRYL) 25 MG tablet Take 1 tablet (25 mg total) by mouth every 8 (eight) hours as needed for allergies. 03/30/20   Benjiman Core, MD  mometasone (NASONEX) 50 MCG/ACT nasal spray Place 2 sprays into the nose daily.    [provider]  pantoprazole (PROTONIX) 20 MG tablet Take 1 tablet (20 mg total) by mouth daily. 12/08/14   Waldon Merl, PA-C  simethicone (MYLICON) 80 MG chewable tablet Chew 80 mg by mouth 3 (three)  times daily as needed for flatulence.    [provider]    Allergies    Influenza vaccines and Penicillins  Review of Systems   Review of Systems  Constitutional: Positive for appetite change, chills and fever.  HENT: Positive for congestion.   Respiratory: Positive for cough.   Gastrointestinal: Positive for nausea. Negative for abdominal pain, diarrhea and vomiting.  Genitourinary: Negative for flank pain.  Skin: Negative for rash.  Neurological: Negative for weakness.  Psychiatric/Behavioral: Negative for confusion.    Physical Exam Updated Vital Signs BP (!) 188/120 (BP Location: Right Arm) Comment: pt very anxious and tense and unable to sit still during BP assessment.   Pulse 99   Temp 97.6 F (36.4 C) (Oral)   Resp 18   Ht 5\' 8"  (1.727 m)   Wt 122.5 kg   SpO2 95%   BMI 41.05 kg/m   Physical Exam Vitals and nursing note reviewed.  HENT:      Head: Normocephalic.     Ears:     Comments: Bilateral TMs with mild erythema.    Mouth/Throat:     Comments: Mild posterior pharyngeal without exudate. Eyes:     Extraocular Movements: Extraocular movements intact.  Cardiovascular:     Rate and Rhythm: Regular rhythm. Tachycardia present.  Pulmonary:     Breath sounds: No wheezing, rhonchi or rales.  Abdominal:     Tenderness: There is no abdominal tenderness.  Musculoskeletal:     Right lower leg: No edema.     Left lower leg: No edema.  Skin:    General: Skin is warm.     Capillary Refill: Capillary refill takes less than 2 seconds.  Neurological:     Mental Status: He is alert. Mental status is at baseline.  Psychiatric:     Comments: Patient appears anxious.     ED Results / Procedures / Treatments   Labs (all labs ordered are listed, but only abnormal results are displayed) Labs Reviewed  POC SARS CORONAVIRUS 2 AG -  ED - Abnormal; Notable for the following components:      Result Value   SARS Coronavirus 2 Ag POSITIVE (*)    All other components within normal limits    EKG None  Radiology DG Chest Portable 1 View  Result Date: 03/30/2020 CLINICAL DATA:  Fever.  Cough. EXAM: PORTABLE CHEST 1 VIEW COMPARISON:  No prior. FINDINGS: Mediastinum hilar structures normal. Lungs are clear. Heart size normal. Left costophrenic angle incompletely imaged. No pleural effusion or pneumothorax identified. No acute bony abnormality. IMPRESSION: No acute cardiopulmonary disease. Electronically Signed   By: Marcello Moores  Register   On: 03/30/2020 10:27    Procedures Procedures (including critical care time)  Medications Ordered in ED Medications  acetaminophen (TYLENOL) tablet 650 mg (650 mg Oral Given 03/30/20 0957)  clonazePAM (KLONOPIN) tablet 1 mg (1 mg Oral Given 03/30/20 1202)    ED Course  I have reviewed the triage vital signs and the nursing notes.  Pertinent labs & imaging results that were available during my care of the  patient were reviewed by me and considered in my medical decision making (see chart for details).    MDM Rules/Calculators/A&P                      Patient with URI symptoms.  Found to have Covid infection.  Not hypoxic.  Patient does have a reported history of autism and anxiety.  Not hypoxic and discussed with antibody  infusion center.  They potentially would be able to do an infusion here, however patient is eager to leave.  Does not want to stay.  Will arrange follow-up and hopefully will get the infusion as an outpatient.  Qualifies due to BMI.  Discharge home. Final Clinical Impression(s) / ED Diagnoses Final diagnoses:  COVID-19    Rx / DC Orders ED Discharge Orders         Ordered    diphenhydrAMINE (BENADRYL) 25 MG tablet  Every 8 hours PRN     03/30/20 1131           Benjiman Core, MD 03/30/20 1510

## 2020-03-30 NOTE — Discharge Instructions (Addendum)
You will be contacted about an antibody infusion tomorrow.

## 2020-03-30 NOTE — ED Notes (Signed)
Delay in discharge due to sister being unable to come pick up patient. Social work is attempting to find transportation home for patient.

## 2020-03-30 NOTE — ED Triage Notes (Signed)
Pt BIBA from home.   Per EMS- Pt c/o symptomatic fever, weakness, anxiety x1 day.   Pt with hx of autism. AOx4, ambulatory on scene.

## 2020-03-30 NOTE — Progress Notes (Signed)
CSW assisted patient with transportation back home through [employer] CSW awaiting time when patient's [employer] will be here.   Geralyn Corwin, LCSW Transitions of Care Department [facility] ED [phone]

## 2020-03-30 NOTE — Telephone Encounter (Signed)
Called to Discuss with patient about Covid symptoms and the use of bamlanivimab, a monoclonal antibody infusion for those with mild to moderate Covid symptoms and at a high risk of hospitalization.     Pt is qualified for this infusion at the [facility] infusion center due to co-morbid conditions and/or a member of an at-risk group.     Unable to reach pt. Left message on patient's phone and sister's phone. No mychart available.   Consuello Masse, DNP, AGNP-C [phone] (Infusion Center Hotline)

## 2020-03-31 ENCOUNTER — Telehealth: Payer: Self-pay | Admitting: Unknown Physician Specialty

## 2020-03-31 NOTE — Telephone Encounter (Signed)
Called to discuss with patient about Covid symptoms and the use of bamlanivimab, a monoclonal antibody infusion for those with mild to moderate Covid symptoms and at a high risk of hospitalization.  Pt is qualified for this infusion at the [facility] infusion center due to BMI>35   Message left to call back  

## 2020-03-31 NOTE — Telephone Encounter (Signed)
Called to discuss with London Sheer about Covid symptoms and the use of bamlanivimab, a monoclonal antibody infusion for those with mild to moderate Covid symptoms and at a high risk of hospitalization.     Pt is qualified for this infusion at the [facility] infusion center due to co-morbid conditions and/or a member of an at-risk group, however declines infusion at this time. Symptoms tier reviewed as well as criteria for ending isolation.  Symptoms reviewed that would warrant ED/Hospital evaluation. Preventative practices reviewed. Patient verbalized understanding. Patient advised to call back if he decides that he does want to get infusion. Callback number to the infusion center given. Patient advised to go to Urgent care or ED with severe symptoms. Last date he would be eligible for infusion is 4/16   Patient Active Problem List   Diagnosis Date Noted  . Hyperlipidemia 10/18/2014  . Gastroesophageal reflux disease with esophagitis 10/18/2014  . GAD (generalized anxiety disorder) 10/18/2014  . Elevated BP 10/18/2014

## 2020-08-30 ENCOUNTER — Other Ambulatory Visit: Payer: Self-pay

## 2020-08-30 ENCOUNTER — Emergency Department (HOSPITAL_COMMUNITY): Payer: Medicare HMO

## 2020-08-30 ENCOUNTER — Emergency Department (HOSPITAL_COMMUNITY)
Admission: EM | Admit: 2020-08-30 | Discharge: 2020-08-31 | Disposition: A | Discharge status: Home / Self Care | Payer: Medicare HMO | Attending: Emergency Medicine | Admitting: Emergency Medicine

## 2020-08-30 DIAGNOSIS — I700 Atherosclerosis of aorta: Secondary | ICD-10-CM | POA: Insufficient documentation

## 2020-08-30 DIAGNOSIS — T07XXXA Unspecified multiple injuries, initial encounter: Secondary | ICD-10-CM | POA: Insufficient documentation

## 2020-08-30 DIAGNOSIS — Y9389 Activity, other specified: Secondary | ICD-10-CM | POA: Diagnosis not present

## 2020-08-30 DIAGNOSIS — Y998 Other external cause status: Secondary | ICD-10-CM | POA: Insufficient documentation

## 2020-08-30 DIAGNOSIS — F840 Autistic disorder: Secondary | ICD-10-CM | POA: Insufficient documentation

## 2020-08-30 DIAGNOSIS — S0181XA Laceration without foreign body of other part of head, initial encounter: Secondary | ICD-10-CM | POA: Insufficient documentation

## 2020-08-30 DIAGNOSIS — Z79899 Other long term (current) drug therapy: Secondary | ICD-10-CM | POA: Diagnosis not present

## 2020-08-30 DIAGNOSIS — S022XXA Fracture of nasal bones, initial encounter for closed fracture: Secondary | ICD-10-CM | POA: Insufficient documentation

## 2020-08-30 DIAGNOSIS — I2510 Atherosclerotic heart disease of native coronary artery without angina pectoris: Secondary | ICD-10-CM | POA: Insufficient documentation

## 2020-08-30 DIAGNOSIS — Y9289 Other specified places as the place of occurrence of the external cause: Secondary | ICD-10-CM | POA: Diagnosis not present

## 2020-08-30 DIAGNOSIS — K760 Fatty (change of) liver, not elsewhere classified: Secondary | ICD-10-CM | POA: Diagnosis not present

## 2020-08-30 DIAGNOSIS — S0121XA Laceration without foreign body of nose, initial encounter: Secondary | ICD-10-CM | POA: Insufficient documentation

## 2020-08-30 DIAGNOSIS — R4182 Altered mental status, unspecified: Secondary | ICD-10-CM | POA: Diagnosis present

## 2020-08-30 DIAGNOSIS — S299XXA Unspecified injury of thorax, initial encounter: Secondary | ICD-10-CM | POA: Insufficient documentation

## 2020-08-30 DIAGNOSIS — S3991XA Unspecified injury of abdomen, initial encounter: Secondary | ICD-10-CM | POA: Insufficient documentation

## 2020-08-30 LAB — I-STAT CHEM 8, ED
BUN: 14 mg/dL (ref 6–20)
Calcium, Ion: 1.07 mmol/L — ABNORMAL LOW (ref 1.15–1.40)
Chloride: 104 mmol/L (ref 98–111)
Creatinine, Ser: 1.3 mg/dL — ABNORMAL HIGH (ref 0.61–1.24)
Glucose, Bld: 124 mg/dL — ABNORMAL HIGH (ref 70–99)
HCT: 41 % (ref 39.0–52.0)
Hemoglobin: 13.9 g/dL (ref 13.0–17.0)
Potassium: 3.2 mmol/L — ABNORMAL LOW (ref 3.5–5.1)
Sodium: 136 mmol/L (ref 135–145)
TCO2: 19 mmol/L — ABNORMAL LOW (ref 22–32)

## 2020-08-30 LAB — CBC
HCT: 40.4 % (ref 39.0–52.0)
Hemoglobin: 12.9 g/dL — ABNORMAL LOW (ref 13.0–17.0)
MCH: 29.3 pg (ref 26.0–34.0)
MCHC: 31.9 g/dL (ref 30.0–36.0)
MCV: 91.6 fL (ref 80.0–100.0)
Platelets: 263 10*3/uL (ref 150–400)
RBC: 4.41 MIL/uL (ref 4.22–5.81)
RDW: 13.4 % (ref 11.5–15.5)
WBC: 8.8 10*3/uL (ref 4.0–10.5)
nRBC: 0 % (ref 0.0–0.2)

## 2020-08-30 LAB — COMPREHENSIVE METABOLIC PANEL
ALT: 147 U/L — ABNORMAL HIGH (ref 0–44)
AST: 382 U/L — ABNORMAL HIGH (ref 15–41)
Albumin: 3.4 g/dL — ABNORMAL LOW (ref 3.5–5.0)
Alkaline Phosphatase: 66 U/L (ref 38–126)
Anion gap: 16 — ABNORMAL HIGH (ref 5–15)
BUN: 12 mg/dL (ref 6–20)
CO2: 16 mmol/L — ABNORMAL LOW (ref 22–32)
Calcium: 8.6 mg/dL — ABNORMAL LOW (ref 8.9–10.3)
Chloride: 100 mmol/L (ref 98–111)
Creatinine, Ser: 1.03 mg/dL (ref 0.61–1.24)
GFR calc Af Amer: >60 mL/min (ref >60)
GFR calc non Af Amer: >60 mL/min (ref >60)
Glucose, Bld: 126 mg/dL — ABNORMAL HIGH (ref 70–99)
Potassium: 3.2 mmol/L — ABNORMAL LOW (ref 3.5–5.1)
Sodium: 132 mmol/L — ABNORMAL LOW (ref 135–145)
Total Bilirubin: 0.5 mg/dL (ref 0.3–1.2)
Total Protein: 7.1 g/dL (ref 6.5–8.1)

## 2020-08-30 LAB — SAMPLE TO BLOOD BANK

## 2020-08-30 LAB — PROTIME-INR
INR: 1 (ref 0.8–1.2)
Prothrombin Time: 12.7 seconds (ref 11.4–15.2)

## 2020-08-30 LAB — ETHANOL: Alcohol, Ethyl (B): 135 mg/dL — ABNORMAL HIGH (ref <10)

## 2020-08-30 LAB — LACTIC ACID, PLASMA: Lactic Acid, Venous: 3.1 mmol/L (ref 0.5–1.9)

## 2020-08-30 MED ORDER — ONDANSETRON HCL 4 MG/2ML IJ SOLN
4.0000 mg | Freq: Once | INTRAMUSCULAR | Status: AC
  Administered 2020-08-30: 4 mg via INTRAVENOUS
  Filled 2020-08-30: qty 2

## 2020-08-30 MED ORDER — IOHEXOL 300 MG/ML  SOLN
100.0000 mL | Freq: Once | INTRAMUSCULAR | Status: AC | PRN
  Administered 2020-08-30: 100 mL via INTRAVENOUS

## 2020-08-30 MED ORDER — FENTANYL CITRATE (PF) 100 MCG/2ML IJ SOLN
50.0000 ug | Freq: Once | INTRAMUSCULAR | Status: AC
  Administered 2020-08-30: 50 ug via INTRAVENOUS
  Filled 2020-08-30: qty 2

## 2020-08-30 MED ORDER — LIDOCAINE-EPINEPHRINE 1 %-1:100000 IJ SOLN
10.0000 mL | Freq: Once | INTRAMUSCULAR | Status: AC
  Administered 2020-08-31: 10 mL
  Filled 2020-08-30: qty 1

## 2020-08-30 NOTE — ED Notes (Signed)
Pt A&oX4 per EMS hx of autism, GCS 14, has repetitive questioning at this time, pt requesting sister be called, Chaplin at bedside provided family information to call family. Active bleeding to laceration to forehead controlled at this time.
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
## 2020-08-30 NOTE — ED Notes (Signed)
6 staples to laceration on forehead

## 2020-08-30 NOTE — ED Notes (Signed)
Pt sister at bedside reports pt is at appropriate baseline, sister stated " he has autism, so his repeated questioning is usual for him." Pt nose bleeding, bleeding controlled with pressure gauze at this time, PD at bedside.

## 2020-08-30 NOTE — ED Notes (Signed)
[place] PD at bedside at this time

## 2020-08-30 NOTE — ED Notes (Signed)
Spoke with Stanton Kidney regarding pt update per pt request.

## 2020-08-30 NOTE — ED Notes (Signed)
Date and time results received: 08/30/20 10:17pm (use smartphrase ".now" to insert current time)  Test: Lactic Critical Value: 3.1  Name of Provider Notified: MD Rubin Payor  Orders Received? Or Actions Taken?: Awaiting orders
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
## 2020-08-30 NOTE — ED Notes (Signed)
Pt asking repetitive questions at this time.
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
## 2020-08-30 NOTE — ED Notes (Signed)
Patient transported to CT 

## 2020-08-31 ENCOUNTER — Telehealth: Payer: Self-pay | Admitting: *Deleted

## 2020-08-31 DIAGNOSIS — S022XXA Fracture of nasal bones, initial encounter for closed fracture: Secondary | ICD-10-CM | POA: Diagnosis not present

## 2020-08-31 MED ORDER — NAPROXEN 500 MG PO TABS: 500.0000 mg | ORAL_TABLET | Freq: Two times a day (BID) | ORAL | 0 refills | Status: DC

## 2020-08-31 MED ORDER — OXYMETAZOLINE HCL 0.05 % NA SOLN
2.0000 | Freq: Once | NASAL | Status: AC
  Administered 2020-08-31: 2 via NASAL
  Filled 2020-08-31: qty 30

## 2020-08-31 NOTE — ED Provider Notes (Signed)
  Patient signed out pending repair by plastic surgery and reassessment.  Surgical recommendations in discharge paperwork for Dr. Adrienne Mocha.  Sister is requesting detox.  Patient is not interested in adamant that he needs to go home.  He has history of autism but does live independently and appears to be able to make decisions for himself.  He is able to drink without difficulty and ambulate independently.  He has a ride home.  He was given wound care instructions.  Additionally, I did placehome health orders for wound care at the sister's request.  Physical Exam  BP 136/84 (BP Location: Right Arm)   Pulse (!) 112   Temp (!) 97.3 F (36.3 C) (Oral)   Resp 20   Ht 1.727 m (5\' 8" )   Wt 122.5 kg   SpO2 97%   BMI 41.05 kg/m         , MD 08/31/20 [phone]

## 2020-08-31 NOTE — Telephone Encounter (Signed)
Dietrich Samuelson J. Lucretia Roers, RN, BSN, [place] [phone] RNCM spoke with pt via telephone regarding discharge planning for Home Health Services. Offered pt medicare.gov list of home health agencies to choose from.  Pt chose [facility] to render services. Tiffany of [facility]  notified. Patient made aware that [facility]  will be in contact in 24-48 hours.  No DME needs identified at this time.

## 2020-08-31 NOTE — Telephone Encounter (Signed)
[facility] declined referral stating any HH referral outside of inpatients needs to come from PCP office.  RNCM referred pt to [facility].  Eather Colas is checking with office for approval.
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
## 2020-08-31 NOTE — ED Notes (Signed)
Pt provided oral nutrition and made aware of ambulatory assessment prior to discharge.

## 2020-08-31 NOTE — Telephone Encounter (Signed)
[facility] accepted referral.  Will contact pt for first visit within 24 hours.

## 2020-08-31 NOTE — ED Notes (Signed)
Provider made aware pt ambulated without assistance, gait steady.

## 2020-08-31 NOTE — Discharge Instructions (Signed)
Apply bacitracin to your wounds.
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
## 2020-08-31 NOTE — ED Provider Notes (Signed)
..  Laceration Repair  Date/Time: 08/31/2020 12:09 AM Performed by: Antony Madura, PA-C Authorized by: Antony Madura, PA-C   Consent:    Consent obtained:  Emergent situation and verbal   Consent given by:  Patient   Risks discussed:  Need for additional repair, pain and poor cosmetic result   Alternatives discussed:  No treatment Anesthesia (see MAR for exact dosages):    Anesthesia method:  Local infiltration   Local anesthetic:  Lidocaine 2% WITH epi Laceration details:    Location:  Leg   Leg location:  L knee   Length (cm):  3 Repair type:    Repair type:  Simple Pre-procedure details:    Preparation:  Patient was prepped and draped in usual sterile fashion Exploration:    Hemostasis achieved with:  Direct pressure Treatment:    Area cleansed with:  Betadine   Amount of cleaning:  Standard   Irrigation solution:  Sterile water   Irrigation volume:  100cc   Irrigation method:  Tap and syringe   Visualized foreign bodies/material removed: yes   Skin repair:    Repair method:  Sutures   Suture size:  4-0   Suture material:  Prolene   Suture technique:  Horizontal mattress   Number of sutures:  3 Approximation:    Approximation:  Close Post-procedure details:    Patient tolerance of procedure:  Tolerated well, no immediate complications      Antony Madura, PA-C 08/31/20 0011    Shon Baton, MD 08/31/20 [phone]

## 2020-09-05 ENCOUNTER — Telehealth: Payer: Self-pay

## 2020-09-05 ENCOUNTER — Observation Stay (HOSPITAL_COMMUNITY)
Admission: EM | Admit: 2020-09-05 | Discharge: 2020-09-06 | Disposition: A | Discharge status: Home / Self Care | Payer: Medicare HMO | Attending: Internal Medicine | Admitting: Internal Medicine

## 2020-09-05 ENCOUNTER — Encounter (HOSPITAL_COMMUNITY): Payer: Self-pay

## 2020-09-05 DIAGNOSIS — Z79899 Other long term (current) drug therapy: Secondary | ICD-10-CM | POA: Diagnosis not present

## 2020-09-05 DIAGNOSIS — S01512A Laceration without foreign body of oral cavity, initial encounter: Principal | ICD-10-CM | POA: Insufficient documentation

## 2020-09-05 DIAGNOSIS — Y9355 Activity, bike riding: Secondary | ICD-10-CM | POA: Diagnosis not present

## 2020-09-05 DIAGNOSIS — F17200 Nicotine dependence, unspecified, uncomplicated: Secondary | ICD-10-CM | POA: Insufficient documentation

## 2020-09-05 DIAGNOSIS — S01512S Laceration without foreign body of oral cavity, sequela: Secondary | ICD-10-CM

## 2020-09-05 DIAGNOSIS — F411 Generalized anxiety disorder: Secondary | ICD-10-CM

## 2020-09-05 DIAGNOSIS — R030 Elevated blood-pressure reading, without diagnosis of hypertension: Secondary | ICD-10-CM | POA: Diagnosis not present

## 2020-09-05 DIAGNOSIS — I10 Essential (primary) hypertension: Secondary | ICD-10-CM | POA: Insufficient documentation

## 2020-09-05 DIAGNOSIS — E785 Hyperlipidemia, unspecified: Secondary | ICD-10-CM | POA: Diagnosis present

## 2020-09-05 LAB — PROTIME-INR
INR: 1.1 (ref 0.8–1.2)
Prothrombin Time: 13.5 seconds (ref 11.4–15.2)

## 2020-09-05 LAB — CBC WITH DIFFERENTIAL/PLATELET
Abs Immature Granulocytes: 0.06 10*3/uL (ref 0.00–0.07)
Basophils Absolute: 0 10*3/uL (ref 0.0–0.1)
Basophils Relative: 1 %
Eosinophils Absolute: 0.2 10*3/uL (ref 0.0–0.5)
Eosinophils Relative: 3 %
HCT: 38.3 % — ABNORMAL LOW (ref 39.0–52.0)
Hemoglobin: 12.1 g/dL — ABNORMAL LOW (ref 13.0–17.0)
Immature Granulocytes: 1 %
Lymphocytes Relative: 20 %
Lymphs Abs: 1.4 10*3/uL (ref 0.7–4.0)
MCH: 29.4 pg (ref 26.0–34.0)
MCHC: 31.6 g/dL (ref 30.0–36.0)
MCV: 93 fL (ref 80.0–100.0)
Monocytes Absolute: 0.7 10*3/uL (ref 0.1–1.0)
Monocytes Relative: 10 %
Neutro Abs: 4.5 10*3/uL (ref 1.7–7.7)
Neutrophils Relative %: 65 %
Platelets: 290 10*3/uL (ref 150–400)
RBC: 4.12 MIL/uL — ABNORMAL LOW (ref 4.22–5.81)
RDW: 13.5 % (ref 11.5–15.5)
WBC: 6.8 10*3/uL (ref 4.0–10.5)
nRBC: 0 % (ref 0.0–0.2)

## 2020-09-05 LAB — BASIC METABOLIC PANEL
Anion gap: 13 (ref 5–15)
BUN: 10 mg/dL (ref 6–20)
CO2: 24 mmol/L (ref 22–32)
Calcium: 9.1 mg/dL (ref 8.9–10.3)
Chloride: 97 mmol/L — ABNORMAL LOW (ref 98–111)
Creatinine, Ser: 1.02 mg/dL (ref 0.61–1.24)
GFR calc Af Amer: >60 mL/min (ref >60)
GFR calc non Af Amer: >60 mL/min (ref >60)
Glucose, Bld: 115 mg/dL — ABNORMAL HIGH (ref 70–99)
Potassium: 3.6 mmol/L (ref 3.5–5.1)
Sodium: 134 mmol/L — ABNORMAL LOW (ref 135–145)

## 2020-09-05 MED ORDER — SODIUM CHLORIDE 0.9% FLUSH: 3.0000 mL | Freq: Two times a day (BID) | INTRAVENOUS | Status: DC

## 2020-09-05 MED ORDER — PROPOFOL 10 MG/ML IV BOLUS
INTRAVENOUS | Status: AC | PRN
  Administered 2020-09-05 (×5): 20 mg via INTRAVENOUS
  Administered 2020-09-05 (×2): 40 mg via INTRAVENOUS
  Administered 2020-09-05 (×2): 20 mg via INTRAVENOUS

## 2020-09-05 MED ORDER — HYDRALAZINE HCL 20 MG/ML IJ SOLN: 5.0000 mg | Freq: Four times a day (QID) | INTRAMUSCULAR | Status: DC | PRN

## 2020-09-05 MED ORDER — SODIUM CHLORIDE 0.9% FLUSH: 3.0000 mL | INTRAVENOUS | Status: DC | PRN

## 2020-09-05 MED ORDER — LORAZEPAM 2 MG/ML IJ SOLN: 1.0000 mg | INTRAMUSCULAR | Status: DC | PRN

## 2020-09-05 MED ORDER — PROPOFOL 10 MG/ML IV BOLUS
INTRAVENOUS | Status: AC | PRN
  Administered 2020-09-05: 20 mg via INTRAVENOUS

## 2020-09-05 MED ORDER — SODIUM CHLORIDE 0.9 % IV SOLN: 250.0000 mL | INTRAVENOUS | Status: DC | PRN

## 2020-09-05 MED ORDER — FLUTICASONE PROPIONATE 50 MCG/ACT NA SUSP
2.0000 | Freq: Every day | NASAL | Status: DC
  Filled 2020-09-05: qty 16

## 2020-09-05 MED ORDER — ALPRAZOLAM 0.25 MG PO TABS: 1.0000 mg | ORAL_TABLET | Freq: Four times a day (QID) | ORAL | Status: DC | PRN

## 2020-09-05 MED ORDER — PROPOFOL 10 MG/ML IV BOLUS
INTRAVENOUS | Status: AC
  Filled 2020-09-05: qty 20

## 2020-09-05 MED ORDER — NAPROXEN 250 MG PO TABS
500.0000 mg | ORAL_TABLET | Freq: Two times a day (BID) | ORAL | Status: DC
  Administered 2020-09-05: 500 mg via ORAL
  Filled 2020-09-05: qty 2

## 2020-09-05 MED ORDER — ATORVASTATIN CALCIUM 10 MG PO TABS: 20.0000 mg | ORAL_TABLET | Freq: Every day | ORAL | Status: DC

## 2020-09-05 MED ORDER — LIDOCAINE HCL (PF) 1 % IJ SOLN
INTRAMUSCULAR | Status: AC
  Filled 2020-09-05: qty 30

## 2020-09-05 MED ORDER — PANTOPRAZOLE SODIUM 40 MG PO TBEC: 40.0000 mg | DELAYED_RELEASE_TABLET | Freq: Every day | ORAL | Status: DC

## 2020-09-05 MED ORDER — CLONAZEPAM 0.5 MG PO TABS
1.0000 mg | ORAL_TABLET | Freq: Four times a day (QID) | ORAL | Status: DC | PRN
  Administered 2020-09-05: 1 mg via ORAL
  Filled 2020-09-05: qty 2

## 2020-09-05 MED ORDER — TRANEXAMIC ACID FOR EPISTAXIS
500.0000 mg | Freq: Once | TOPICAL | Status: AC
  Administered 2020-09-05: 500 mg via TOPICAL
  Filled 2020-09-05: qty 10

## 2020-09-05 MED ORDER — DIPHENHYDRAMINE HCL 25 MG PO CAPS: 25.0000 mg | ORAL_CAPSULE | Freq: Three times a day (TID) | ORAL | Status: DC | PRN

## 2020-09-05 MED ORDER — KETOROLAC TROMETHAMINE 30 MG/ML IJ SOLN: 30.0000 mg | Freq: Four times a day (QID) | INTRAMUSCULAR | Status: DC | PRN

## 2020-09-05 MED ORDER — LORAZEPAM 2 MG/ML IJ SOLN
1.0000 mg | Freq: Once | INTRAMUSCULAR | Status: AC
  Administered 2020-09-05: 1 mg via INTRAVENOUS
  Filled 2020-09-05: qty 1

## 2020-09-05 MED ORDER — ACETAMINOPHEN 500 MG PO TABS
1000.0000 mg | ORAL_TABLET | Freq: Four times a day (QID) | ORAL | Status: DC | PRN
  Administered 2020-09-05: 1000 mg via ORAL
  Filled 2020-09-05: qty 2

## 2020-09-05 MED ORDER — PROPOFOL 10 MG/ML IV BOLUS
0.5000 mg/kg | Freq: Once | INTRAVENOUS | Status: DC
  Filled 2020-09-05: qty 20

## 2020-09-05 MED ORDER — CITALOPRAM HYDROBROMIDE 10 MG PO TABS
20.0000 mg | ORAL_TABLET | Freq: Two times a day (BID) | ORAL | Status: DC
  Filled 2020-09-05: qty 2

## 2020-09-05 MED ORDER — SIMETHICONE 80 MG PO CHEW: 80.0000 mg | CHEWABLE_TABLET | Freq: Three times a day (TID) | ORAL | Status: DC | PRN

## 2020-09-05 MED ORDER — CLINDAMYCIN PHOSPHATE 600 MG/50ML IV SOLN
600.0000 mg | Freq: Once | INTRAVENOUS | Status: AC
  Administered 2020-09-06: 600 mg via INTRAVENOUS
  Filled 2020-09-05: qty 50

## 2020-09-05 NOTE — Procedures (Signed)
Procedure Note  Preoperative Dx: Complex tongue laceration  Postoperative Dx: Same  Procedure: Repair of complex tongue laceration 5 cm  Anesthesia: Lidocaine 1% with 1:100,000 epinepherine  Indication for Procedure: Tongue laceration  Description of Procedure: Risks and complications were explained to the patient.  Consent was confirmed and the patient understands the risks and benefits.  The potential complications and alternatives were explained and the patient consents.  The patient expressed understanding the option of not having the procedure and the risks of a scar.  Time out was called and all information was confirmed to be correct.    The patient was prepped and draped.  With assistance from the emergency room department the patient was sedated.  Once he was adequately sedated a 3-0 Vicryl was used to gain control of the tongue from the tip.  The Yankauer was used to remove the clotted blood from both sides of the tongue.  The patient had a 2 cm horizontal laceration of the palatal side of the tongue.  Lidocaine with epinephrine was in injected around the lacerations for improved patient compliance, comfort and hemostasis.  The lingular deep side of the tongue had a 3 cm laceration.  This was cleaned of the blood clot.  The 3-0 Vicryl was used to close the laceration with a combination of vertical mattress and simple interrupted sutures.  Attention was then turned to the palatal side of the tongue and this was closed with a 3-0 Vicryl using a combination of simple interrupted and figure-of-eight sutures in order to control the bleeding as well.  The patient was given instructions on how to care for the area and a follow up appointment.  Cloy tolerated the procedure well and there were no complications.
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
## 2020-09-05 NOTE — Consult Note (Signed)
Entered in Error
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
## 2020-09-05 NOTE — ED Triage Notes (Signed)
Pt from home with ems for bleeding from under his tongue, pt seen here last week for [facility]. HX of autism. Hypertensive with ems. Pt alert.   Laceration noted to top of tongue and underneath tongue, laceration does not go through. Bleeding controlled at this time

## 2020-09-05 NOTE — Consult Note (Signed)
Reason for Consult: Tongue laceration Referring Physician: Dr. Riley Lam below  Trevor Norris is an 54 y.o. male.  HPI: The patient is a 54 year old gentleman who is here due to bleeding from his mouth.  The patient states that around noon he noticed the bleeding.  He may have bitten his tongue and noticed a lot of blood making it difficult for him to swallow and breathe.  He was brought to the emergency room and was found to have the laceration on the palatal side and lingual side of the tongue.  This is a significant change from his ER visit 5 days ago.  It appears to be a reinjury.  He had arterial bleeding with squirting and significant amount of blood.  The physician on call was called and declined.  I was called because I had seen the patient 5 days ago.  Upon arrival he had a large amount of blood and significant clotting in his mouth on the palatal side and lingual side of his tongue.  The laceration on the palatal side of his tongue was 2 cm in size there was an additional laceration on the bottom part of his tongue which was 3 cm in size.  No other new injuries were noted.  Past Medical History:  Diagnosis Date  . Anxiety   . Environmental allergies   . GERD (gastroesophageal reflux disease)   . Hay fever   . HTN (hypertension), benign   . Hyperlipidemia     Past Surgical History:  Procedure Laterality Date  . Unremarkable      Family History  Problem Relation Age of Onset  . Arthritis Mother        Living  . Hypertension Mother   . Hypertension Father   . Diabetes Father   . Colon cancer Father 11       Deceased  . Heart disease Other        Grandparent  . Stroke Other        Grandparent  . Prostate cancer Other        Uncle  . Sudden death Other        Aunt & Uncle    Social History:  reports that he has been smoking. He does not have any smokeless tobacco history on file. No history on file for alcohol use and drug use.  Allergies:  Allergies  Allergen  Reactions  . Influenza Vaccines Anaphylaxis  . Penicillins   . Penicillins     Medications: I have reviewed the patient's current medications.  Results for orders placed or performed during the hospital encounter of 09/05/20 (from the past 48 hour(s))  Basic metabolic panel     Status: Abnormal   Collection Time: 09/05/20  5:50 PM  Result Value Ref Range   Sodium 134 (L) 135 - 145 mmol/L   Potassium 3.6 3.5 - 5.1 mmol/L   Chloride 97 (L) 98 - 111 mmol/L   CO2 24 22 - 32 mmol/L   Glucose, Bld 115 (H) 70 - 99 mg/dL    Comment: Glucose reference range applies only to samples taken after fasting for at least 8 hours.   BUN 10 6 - 20 mg/dL   Creatinine, Ser 9.32 0.61 - 1.24 mg/dL   Calcium 9.1 8.9 - 67.1 mg/dL   GFR calc non Af Amer >60 >60 mL/min   GFR calc Af Amer >60 >60 mL/min   Anion gap 13 5 - 15    Comment: Performed at [facility]  [facility], [address] [place], [place] [address]  CBC with Differential     Status: Abnormal   Collection Time: 09/05/20  5:50 PM  Result Value Ref Range   WBC 6.8 4.0 - 10.5 K/uL   RBC 4.12 (L) 4.22 - 5.81 MIL/uL   Hemoglobin 12.1 (L) 13.0 - 17.0 g/dL   HCT 27.7 (L) 39 - 52 %   MCV 93.0 80.0 - 100.0 fL   MCH 29.4 26.0 - 34.0 pg   MCHC 31.6 30.0 - 36.0 g/dL   RDW 41.2 87.8 - 67.6 %   Platelets 290 150 - 400 K/uL   nRBC 0.0 0.0 - 0.2 %   Neutrophils Relative % 65 %   Neutro Abs 4.5 1.7 - 7.7 K/uL   Lymphocytes Relative 20 %   Lymphs Abs 1.4 0.7 - 4.0 K/uL   Monocytes Relative 10 %   Monocytes Absolute 0.7 0 - 1 K/uL   Eosinophils Relative 3 %   Eosinophils Absolute 0.2 0 - 0 K/uL   Basophils Relative 1 %   Basophils Absolute 0.0 0 - 0 K/uL   Immature Granulocytes 1 %   Abs Immature Granulocytes 0.06 0.00 - 0.07 K/uL    Comment: Performed at [facility], [address], [place], [place] [address]  Protime-INR     Status: None   Collection Time: 09/05/20  5:50 PM  Result Value Ref Range   Prothrombin Time 13.5 11.4 - 15.2  seconds   INR 1.1 0.8 - 1.2    Comment: (NOTE) INR goal varies based on device and disease states. Performed at [facility], [address], [place], [place] [address]     No results found.  Review of Systems  Constitutional: Positive for activity change and appetite change.  HENT: Positive for facial swelling and mouth sores.   Eyes: Negative.   Respiratory: Negative.   Cardiovascular: Negative.   Gastrointestinal: Negative.   Endocrine: Negative.   Genitourinary: Negative.   Skin: Positive for wound.  Hematological: Negative.    Blood pressure (!) 198/107, pulse 100, temperature (!) 97.5 F (36.4 C), temperature source Oral, resp. rate 14, SpO2 98 %. Physical Exam Vitals and nursing note reviewed.  HENT:     Head:      Mouth/Throat:   Cardiovascular:     Rate and Rhythm: Normal rate.     Pulses: Normal pulses.  Skin:    General: Skin is warm.  Neurological:     Mental Status: He is alert.     Assessment/Plan: Tongue laceration.  This was repaired in the emergency room with the amazing help of the emergency room team at the bedside.  Alena Bills Matix Henshaw 09/05/2020, 9:24 PM

## 2020-09-05 NOTE — Telephone Encounter (Signed)
Harlin Rain with [facility] called after seeing patient today. She states that she used Xeroform because the dry dressing that was on from the hospital was stuck to his skin. He has a posterior and anterior tongue laceration for which he is on a liquid diet. She is concerned of thrush as he has a dry mouth and is recommending a prescription of magic mouthwash. Harlin Rain can be reached at [phone].

## 2020-09-05 NOTE — ED Notes (Signed)
Pt sister Gavin Pound called and updated on pts status. Sister would like to be kept updated on status of pt throughout visit.

## 2020-09-05 NOTE — Sedation Documentation (Signed)
Provider and specialist bedside

## 2020-09-06 DIAGNOSIS — E7800 Pure hypercholesterolemia, unspecified: Secondary | ICD-10-CM | POA: Diagnosis not present

## 2020-09-06 DIAGNOSIS — F411 Generalized anxiety disorder: Secondary | ICD-10-CM | POA: Diagnosis not present

## 2020-09-06 DIAGNOSIS — R030 Elevated blood-pressure reading, without diagnosis of hypertension: Secondary | ICD-10-CM | POA: Diagnosis not present

## 2020-09-06 DIAGNOSIS — S01512A Laceration without foreign body of oral cavity, initial encounter: Secondary | ICD-10-CM | POA: Diagnosis not present

## 2020-09-06 LAB — HIV ANTIBODY (ROUTINE TESTING W REFLEX): HIV Screen 4th Generation wRfx: NONREACTIVE

## 2020-09-06 MED ORDER — TRAMADOL HCL 50 MG PO TABS: 50.0000 mg | ORAL_TABLET | Freq: Two times a day (BID) | ORAL | 0 refills | Status: AC | PRN
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
## 2020-09-06 NOTE — ED Notes (Signed)
Patient ambulated around room independently with steady gait and without issue. Patient stated he does not have any issue walking and does not want to wait for PT to evaluate him.

## 2020-09-08 ENCOUNTER — Institutional Professional Consult (permissible substitution): Payer: Medicare HMO | Admitting: Plastic Surgery

## 2020-09-08 ENCOUNTER — Other Ambulatory Visit: Payer: Self-pay

## 2020-09-08 ENCOUNTER — Ambulatory Visit (INDEPENDENT_AMBULATORY_CARE_PROVIDER_SITE_OTHER): Payer: Medicare HMO | Admitting: Plastic Surgery

## 2020-09-08 VITALS — BP 171/122

## 2020-09-08 DIAGNOSIS — S01512S Laceration without foreign body of oral cavity, sequela: Secondary | ICD-10-CM

## 2020-09-08 DIAGNOSIS — S0993XA Unspecified injury of face, initial encounter: Secondary | ICD-10-CM | POA: Insufficient documentation
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
## 2020-09-13 ENCOUNTER — Telehealth: Payer: Self-pay | Admitting: Plastic Surgery

## 2020-09-13 NOTE — Telephone Encounter (Signed)
Called and [facility] @ 4:13pm) asking the sister to give me a call back regarding her message below.//AB/CMA

## 2020-09-13 NOTE — Telephone Encounter (Signed)
Called and spoke with Shrada,RN with [facility] regarding the message below.  Informed her that I spoke with [facility] and he stated that fine but not to use the Neosporin, but use Triple antibiotic ointment and Vaseline.  Shrada,RN verbalized understanding and agreed.//AB/CMA

## 2020-09-13 NOTE — Telephone Encounter (Signed)
Shrada,RN with [facility], called to let us know that patient's wounds are healing very well and they are dried and scabbed over. There is no drainage coming ut and she is recommending that they put neosporin/triple antibiotic ointment on it and leave it open to the air. Please call her back to advise. [phone]

## 2020-09-13 NOTE — Telephone Encounter (Signed)
Patient's sister, Gavin Pound, called to say that the home health nurse is not following the instructions that were explained to her at the visit. Please call sister to clarify and advise what should be happening.

## 2020-09-14 NOTE — Telephone Encounter (Signed)
Called and spoke with the patient's sister Gavin Pound) regarding her message below.  She stated that she has not heard back from anyone regarding wound care instruction for the patient.  She would like clarification on the wound care orders.    Informed her that Shrada,RN with [facility] called to give [place] a report on how the patient's wounds were doing.  Burman Blacksmith stated that the patient's wounds are healing very well and they are dried and scabbed over.  There is no drainage coming out and she's recommending that they put neosporin/triple antibiotic ointment on it and leave it open to the air.    The sister stated that when they were in the office last week they were told not to use Neosporin.  Informed her that Matthew,PA-C informed Shrada,RN not to use Neosporin, but use the triple ointment and vaseline.   The sister Gavin Pound) verbalized understanding and stated she just needed to know what to do.//AB/CMA

## 2020-09-20 ENCOUNTER — Telehealth: Payer: Self-pay | Admitting: *Deleted

## 2020-09-20 NOTE — Telephone Encounter (Signed)
Received Physician Order on (09/18/20) via of fax from [facility].  Requesting signature and return.  Given to provider to sign.  Orders signed and faxed back to [facility].  Confirmation received and copy scanned into the chart.//AB/CMA

## 2020-09-29 ENCOUNTER — Telehealth: Payer: Self-pay

## 2020-09-29 NOTE — Telephone Encounter (Signed)
Harlin Rain with [facility] called to let us know that the patient's wounds were healed and he had new skin.  Now he has started having blisters on the new skin (both knees and left arm).  The blister on the right knee has opened up.  The blister on the left knee and left arm have not opened.  Harlin Rain has applied dry gauze to make sure it stays dry.  Please advise.

## 2020-10-02 NOTE — Telephone Encounter (Signed)
Spoke with home health RN, she reports that she noticed the blister started popping up, she reports they do not look infected.  Patient is coming in tomorrow for evaluation with Dr. Ulice Bold.  I discussed with her that if she sees the patient prior to their appointment she can apply Vaseline and gauze.  Recommend calling with any further questions or concerns

## 2020-10-03 ENCOUNTER — Other Ambulatory Visit: Payer: Self-pay

## 2020-10-03 ENCOUNTER — Ambulatory Visit (INDEPENDENT_AMBULATORY_CARE_PROVIDER_SITE_OTHER): Payer: Medicare HMO | Admitting: Plastic Surgery

## 2020-10-03 ENCOUNTER — Encounter: Payer: Self-pay | Admitting: Plastic Surgery

## 2020-10-03 DIAGNOSIS — S0993XA Unspecified injury of face, initial encounter: Secondary | ICD-10-CM | POA: Diagnosis not present
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
## 2020-10-10 ENCOUNTER — Ambulatory Visit: Payer: Medicare HMO | Admitting: Plastic Surgery

## 2020-10-16 ENCOUNTER — Telehealth: Payer: Self-pay

## 2020-10-16 NOTE — Telephone Encounter (Signed)
Returned patients call. LMVM to call back.

## 2020-10-16 NOTE — Telephone Encounter (Signed)
Patient called to let us know that there is a stitch on his lower gumline that's causing tightness, discomfort, and a lisp.  Also, there is something crusty in his nose that is causing discomfort and breathing issues.  Patient has transportation issues.  Please call to advise.

## 2020-10-17 ENCOUNTER — Telehealth: Payer: Self-pay

## 2020-10-17 NOTE — Telephone Encounter (Signed)
Returned patients call. LMVM to call office back. 

## 2020-10-17 NOTE — Telephone Encounter (Signed)
Patient called to say that he has some questions for Dr. Ulice Bold.  He is wondering if it's something he can do himself or if he will need another visit.  Patient said there are a couple of things that don't seem to be 100% but they are getting better.  I did call the patient before writing this note to determine his specific questions but I got his voicemail.

## 2020-10-17 NOTE — Telephone Encounter (Signed)
Patient called to say that he has some questions for Dr. Ulice Bold.  He is wondering if it's something he can do himself or if he will need another visit.  Patient said there are a couple of things that don't seem to be 100% but they are getting better.  I did call the patient before writing this note to see if I could get

## 2020-10-18 NOTE — Telephone Encounter (Signed)
Spoke with patient. He indicated he felt and saw a white stitch between his gum and lip along with feeling like something was obstructing his left nostril making it difficult to breath. I advise him that the stitches that was used were dissolvable. He also mention he just started using the nasal spray Zicam today due to his allergies where bad.  Called patient back, LMVM. Under the advise of Johanna, PA-C, patient may take a warm compress to nose to see if that will loosen up anything they may making it difficult to breath. In regards to the stitch in his mouth, he may set up an appointment first available for observation with the PA on a day Dr. Ulice Bold is in the office.

## 2021-07-13 DIAGNOSIS — I10 Essential (primary) hypertension: Secondary | ICD-10-CM | POA: Diagnosis not present

## 2021-07-13 DIAGNOSIS — Z87891 Personal history of nicotine dependence: Secondary | ICD-10-CM | POA: Diagnosis not present

## 2021-07-13 DIAGNOSIS — K219 Gastro-esophageal reflux disease without esophagitis: Secondary | ICD-10-CM | POA: Diagnosis not present

## 2021-07-13 DIAGNOSIS — E7800 Pure hypercholesterolemia, unspecified: Secondary | ICD-10-CM | POA: Diagnosis not present

## 2021-09-24 ENCOUNTER — Telehealth: Payer: Self-pay

## 2021-10-15 DIAGNOSIS — I10 Essential (primary) hypertension: Secondary | ICD-10-CM | POA: Diagnosis not present

## 2021-10-15 DIAGNOSIS — E7800 Pure hypercholesterolemia, unspecified: Secondary | ICD-10-CM | POA: Diagnosis not present

## 2021-10-15 DIAGNOSIS — K219 Gastro-esophageal reflux disease without esophagitis: Secondary | ICD-10-CM | POA: Diagnosis not present

## 2021-12-21 DIAGNOSIS — R1032 Left lower quadrant pain: Secondary | ICD-10-CM | POA: Diagnosis not present

## 2021-12-21 DIAGNOSIS — R030 Elevated blood-pressure reading, without diagnosis of hypertension: Secondary | ICD-10-CM | POA: Diagnosis not present

## 2022-04-03 DIAGNOSIS — E7800 Pure hypercholesterolemia, unspecified: Secondary | ICD-10-CM | POA: Diagnosis not present

## 2022-04-03 DIAGNOSIS — I10 Essential (primary) hypertension: Secondary | ICD-10-CM | POA: Diagnosis not present

## 2022-04-03 DIAGNOSIS — K219 Gastro-esophageal reflux disease without esophagitis: Secondary | ICD-10-CM | POA: Diagnosis not present

## 2022-04-03 DIAGNOSIS — R7303 Prediabetes: Secondary | ICD-10-CM | POA: Diagnosis not present
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
## 2022-10-01 DIAGNOSIS — I10 Essential (primary) hypertension: Secondary | ICD-10-CM | POA: Diagnosis not present

## 2022-10-01 DIAGNOSIS — E7800 Pure hypercholesterolemia, unspecified: Secondary | ICD-10-CM | POA: Diagnosis not present

## 2022-10-01 DIAGNOSIS — Z23 Encounter for immunization: Secondary | ICD-10-CM | POA: Diagnosis not present

## 2022-10-01 DIAGNOSIS — R7303 Prediabetes: Secondary | ICD-10-CM | POA: Diagnosis not present

## 2022-11-21 DIAGNOSIS — E7800 Pure hypercholesterolemia, unspecified: Secondary | ICD-10-CM | POA: Diagnosis not present

## 2022-11-21 DIAGNOSIS — I10 Essential (primary) hypertension: Secondary | ICD-10-CM | POA: Diagnosis not present

## 2022-11-21 DIAGNOSIS — R7303 Prediabetes: Secondary | ICD-10-CM | POA: Diagnosis not present

## 2022-12-19 DIAGNOSIS — L739 Follicular disorder, unspecified: Secondary | ICD-10-CM | POA: Diagnosis not present

## 2022-12-19 DIAGNOSIS — L309 Dermatitis, unspecified: Secondary | ICD-10-CM | POA: Diagnosis not present

## 2023-01-20 DIAGNOSIS — L249 Irritant contact dermatitis, unspecified cause: Secondary | ICD-10-CM | POA: Diagnosis not present

## 2023-01-20 DIAGNOSIS — R350 Frequency of micturition: Secondary | ICD-10-CM | POA: Diagnosis not present

## 2023-02-03 ENCOUNTER — Encounter: Payer: Self-pay | Admitting: Gastroenterology

## 2023-02-07 DIAGNOSIS — I10 Essential (primary) hypertension: Secondary | ICD-10-CM | POA: Diagnosis not present

## 2023-02-07 DIAGNOSIS — R300 Dysuria: Secondary | ICD-10-CM | POA: Diagnosis not present

## 2023-02-17 DIAGNOSIS — K921 Melena: Secondary | ICD-10-CM | POA: Diagnosis not present

## 2023-02-17 DIAGNOSIS — K5909 Other constipation: Secondary | ICD-10-CM | POA: Diagnosis not present

## 2023-02-27 DIAGNOSIS — L821 Other seborrheic keratosis: Secondary | ICD-10-CM | POA: Diagnosis not present

## 2023-04-08 DIAGNOSIS — T63301A Toxic effect of unspecified spider venom, accidental (unintentional), initial encounter: Secondary | ICD-10-CM | POA: Diagnosis not present

## 2023-04-22 ENCOUNTER — Encounter: Payer: Self-pay | Admitting: Gastroenterology

## 2023-04-22 ENCOUNTER — Ambulatory Visit (INDEPENDENT_AMBULATORY_CARE_PROVIDER_SITE_OTHER): Payer: 59 | Admitting: Gastroenterology

## 2023-04-22 VITALS — BP 136/72 | HR 79 | Ht 68.0 in | Wt 283.0 lb

## 2023-04-22 DIAGNOSIS — F1010 Alcohol abuse, uncomplicated: Secondary | ICD-10-CM

## 2023-04-22 DIAGNOSIS — Z1212 Encounter for screening for malignant neoplasm of rectum: Secondary | ICD-10-CM

## 2023-04-22 DIAGNOSIS — K625 Hemorrhage of anus and rectum: Secondary | ICD-10-CM | POA: Diagnosis not present

## 2023-04-22 DIAGNOSIS — Z1211 Encounter for screening for malignant neoplasm of colon: Secondary | ICD-10-CM

## 2023-04-22 MED ORDER — NA SULFATE-K SULFATE-MG SULF 17.5-3.13-1.6 GM/177ML PO SOLN: 1.0000 | Freq: Once | ORAL | 0 refills | Status: AC

## 2023-04-22 NOTE — Patient Instructions (Signed)
_______________________________________________________  If your blood pressure at your visit was 140/90 or greater, please contact your primary care physician to follow up on this.  _______________________________________________________  If you are age 57 or older, your body mass index should be between 23-30. Your Body mass index is 43.03 kg/m. If this is out of the aforementioned range listed, please consider follow up with your Primary Care Provider.  If you are age 59 or younger, your body mass index should be between 19-25. Your Body mass index is 43.03 kg/m. If this is out of the aformentioned range listed, please consider follow up with your Primary Care Provider.   You have been scheduled for a colonoscopy. Please follow written instructions given to you at your visit today.  Please pick up your prep supplies at the pharmacy within the next 1-3 days. If you use inhalers (even only as needed), please bring them with you on the day of your procedure.  ________________________________________________________  The [facility] providers would like to encourage you to use [facility] to communicate with providers for non-urgent requests or questions.  Due to long hold times on the telephone, sending your provider a message by [facility] may be a faster and more efficient way to get a response.  Please allow 48 business hours for a response.  Please remember that this is for non-urgent requests.   It was a pleasure to see you today!  Thank you for trusting me with your gastrointestinal care!    Scott E.Cunningham,MD

## 2023-04-22 NOTE — Progress Notes (Unsigned)
HPI : Trevor Norris   No previous   Father:  Esophageal cancer (80s); smoker  Patient quit smoking 2 years ago (would  Sept 2021; smoked a few cigarettes a day.  Having variable stool   Sees blood on occasion, on and off for years  Sometimes has straining with bowel movements  Recently had liver enzymes which were normal.   Has spasms of pain     Past Medical History:  Diagnosis Date   Anxiety    Environmental allergies    GERD (gastroesophageal reflux disease)    Hay fever    HTN (hypertension), benign    Hyperlipidemia      Past Surgical History:  Procedure Laterality Date   Unremarkable     Family History  Problem Relation Age of Onset   Arthritis Mother        Living   Hypertension Mother    Hypertension Father    Diabetes Father    Colon cancer Father 57       Deceased   Heart disease Other        Grandparent   Stroke Other        Grandparent   Prostate cancer Other        Uncle   Sudden death Other        Aunt & Uncle   Social History   Tobacco Use   Smoking status: Every Day   Smokeless tobacco: Never   Current Outpatient Medications  Medication Sig Dispense Refill   acetaminophen (TYLENOL) 500 MG tablet Take 1,000 mg by mouth every 6 (six) hours as needed for mild pain or headache.      atorvastatin (LIPITOR) 20 MG tablet Take 1 tablet (20 mg total) by mouth daily. 30 tablet 6   bisoprolol (ZEBETA) 5 MG tablet Take 1 tablet by mouth daily.     citalopram (CELEXA) 40 MG tablet Take 0.5 tablets (20 mg total) by mouth 2 (two) times daily. (Patient taking differently: Take 40 mg by mouth daily. ) 60 tablet 1   clonazePAM (KLONOPIN) 1 MG tablet Take 1 mg by mouth 4 (four) times daily as needed for anxiety.     diphenhydrAMINE (BENADRYL) 25 MG tablet Take 1 tablet (25 mg total) by mouth every 8 (eight) hours as needed for allergies. (Patient taking differently: Take 25 mg by mouth daily. ) 20 tablet 0   esomeprazole (NEXIUM) 20 MG capsule  Take 20 mg by mouth daily before breakfast.     naproxen (NAPROSYN) 500 MG tablet Take 1 tablet (500 mg total) by mouth 2 (two) times daily. (Patient not taking: Reported on 10/03/2020) 30 tablet 0   No current facility-administered medications for this visit.   Allergies  Allergen Reactions   Influenza Vaccines Anaphylaxis   Iodine Anaphylaxis    Shellfish   Penicillins     Don't remember      Review of Systems: All systems reviewed and negative except where noted in HPI.    No results found.  Physical Exam: BP (!) 166/92 (BP Location: Left Arm, Patient Position: Sitting, Cuff Size: Large)   Pulse 79   Ht 5\' 8"  (1.727 m)   Wt 283 lb (128.4 kg)   BMI 43.03 kg/m  Constitutional: Pleasant,well-developed, Caucasian male in no acute distress.  Accompanied by sister HEENT: Normocephalic and atraumatic. Conjunctivae are normal. No scleral icterus. Neck supple.  Cardiovascular: Normal rate, regular rhythm.  Pulmonary/chest: Effort normal and breath sounds normal. No wheezing, rales or rhonchi. Abdominal:  Soft, nondistended, nontender. Bowel sounds active throughout. There are no masses palpable. No hepatomegaly. Extremities: no edema Lymphadenopathy: No cervical adenopathy noted. Neurological: Alert and oriented to person place and time. Skin: Skin is warm and dry. No rashes noted. Psychiatric: Normal mood and affect. Behavior is normal.  Anxious  CBC    Component Value Date/Time   WBC 6.8 09/05/2020 1750   RBC 4.12 (L) 09/05/2020 1750   HGB 12.1 (L) 09/05/2020 1750   HCT 38.3 (L) 09/05/2020 1750   PLT 290 09/05/2020 1750   MCV 93.0 09/05/2020 1750   MCH 29.4 09/05/2020 1750   MCHC 31.6 09/05/2020 1750   RDW 13.5 09/05/2020 1750   LYMPHSABS 1.4 09/05/2020 1750   MONOABS 0.7 09/05/2020 1750   EOSABS 0.2 09/05/2020 1750   BASOSABS 0.0 09/05/2020 1750    CMP     Component Value Date/Time   NA 134 (L) 09/05/2020 1750   K 3.6 09/05/2020 1750   CL 97 (L) 09/05/2020  1750   CO2 24 09/05/2020 1750   GLUCOSE 115 (H) 09/05/2020 1750   BUN 10 09/05/2020 1750   CREATININE 1.02 09/05/2020 1750   CALCIUM 9.1 09/05/2020 1750   PROT 7.1 08/30/2020 2114   ALBUMIN 3.4 (L) 08/30/2020 2114   AST 382 (H) 08/30/2020 2114   ALT 147 (H) 08/30/2020 2114   ALKPHOS 66 08/30/2020 2114   BILITOT 0.5 08/30/2020 2114   GFRNONAA >60 09/05/2020 1750   GFRAA >60 09/05/2020 1750     ASSESSMENT AND PLAN:  CRC screening - Colonoscopy  Alcohol use/obesity - RUQUS  Paschal Dopp, PA

## 2023-04-29 DIAGNOSIS — E7800 Pure hypercholesterolemia, unspecified: Secondary | ICD-10-CM | POA: Diagnosis not present

## 2023-04-29 DIAGNOSIS — K219 Gastro-esophageal reflux disease without esophagitis: Secondary | ICD-10-CM | POA: Diagnosis not present

## 2023-05-16 DIAGNOSIS — L03114 Cellulitis of left upper limb: Secondary | ICD-10-CM | POA: Diagnosis not present

## 2023-05-27 ENCOUNTER — Encounter: Payer: Self-pay | Admitting: Gastroenterology

## 2023-05-27 ENCOUNTER — Ambulatory Visit (AMBULATORY_SURGERY_CENTER): Payer: 59 | Admitting: Gastroenterology

## 2023-05-27 VITALS — BP 164/95 | HR 88 | Temp 98.6°F | Resp 10 | Ht 68.0 in | Wt 283.0 lb

## 2023-05-27 DIAGNOSIS — Z1211 Encounter for screening for malignant neoplasm of colon: Secondary | ICD-10-CM

## 2023-05-27 DIAGNOSIS — D120 Benign neoplasm of cecum: Secondary | ICD-10-CM | POA: Diagnosis not present

## 2023-05-27 DIAGNOSIS — D123 Benign neoplasm of transverse colon: Secondary | ICD-10-CM

## 2023-05-27 DIAGNOSIS — D122 Benign neoplasm of ascending colon: Secondary | ICD-10-CM

## 2023-05-27 DIAGNOSIS — I10 Essential (primary) hypertension: Secondary | ICD-10-CM | POA: Diagnosis not present

## 2023-05-27 DIAGNOSIS — K635 Polyp of colon: Secondary | ICD-10-CM | POA: Diagnosis not present

## 2023-05-27 MED ORDER — SODIUM CHLORIDE 0.9 % IV SOLN: 500.0000 mL | Freq: Once | INTRAVENOUS | Status: DC

## 2023-05-27 NOTE — Patient Instructions (Signed)
YOU HAD AN ENDOSCOPIC PROCEDURE TODAY AT THE [facility]:   Refer to the procedure report that was given to you for any specific questions about what was found during the examination.  If the procedure report does not answer your questions, please call your gastroenterologist to clarify.  If you requested that your care partner not be given the details of your procedure findings, then the procedure report has been included in a sealed envelope for you to review at your convenience later.  YOU SHOULD EXPECT: Some feelings of bloating in the abdomen. Passage of more gas than usual.  Walking can help get rid of the air that was put into your GI tract during the procedure and reduce the bloating. If you had a lower endoscopy (such as a colonoscopy or flexible sigmoidoscopy) you may notice spotting of blood in your stool or on the toilet paper. If you underwent a bowel prep for your procedure, you may not have a normal bowel movement for a few days.  Please Note:  You might notice some irritation and congestion in your nose or some drainage.  This is from the oxygen used during your procedure.  There is no need for concern and it should clear up in a day or so.  SYMPTOMS TO REPORT IMMEDIATELY:  Following lower endoscopy (colonoscopy or flexible sigmoidoscopy):  Excessive amounts of blood in the stool  Significant tenderness or worsening of abdominal pains  Swelling of the abdomen that is new, acute  Fever of 100F or higher  For urgent or emergent issues, a gastroenterologist can be reached at any hour by calling [phone]. Do not use MyChart messaging for urgent concerns.    DIET:  We do recommend a small meal at first, but then you may proceed to your regular diet.  Drink plenty of fluids but you should avoid alcoholic beverages for 24 hours.  ACTIVITY:  You should plan to take it easy for the rest of today and you should NOT DRIVE or use heavy machinery until tomorrow (because of  the sedation medicines used during the test).    FOLLOW UP: Our staff will call the number listed on your records the next business day following your procedure.  We will call around 7:15- 8:00 am to check on you and address any questions or concerns that you may have regarding the information given to you following your procedure. If we do not reach you, we will leave a message.     If any biopsies were taken you will be contacted by phone or by letter within the next 1-3 weeks.  Please call us at [phone] if you have not heard about the biopsies in 3 weeks.    SIGNATURES/CONFIDENTIALITY: You and/or your care partner have signed paperwork which will be entered into your electronic medical record.  These signatures attest to the fact that that the information above on your After Visit Summary has been reviewed and is understood.  Full responsibility of the confidentiality of this discharge information lies with you and/or your care-partner.  

## 2023-05-27 NOTE — Progress Notes (Signed)
Called to room to assist during endoscopic procedure.  Patient ID and intended procedure confirmed with present staff. Received instructions for my participation in the procedure from the performing physician.  

## 2023-05-27 NOTE — Progress Notes (Signed)
[facility] History and Physical   Primary Care Physician:  Paschal Dopp, PA   Reason for Procedure:   Colon cancer screening  Plan:    Screening colonoscopy     HPI: Trevor Norris is a 57 y.o. male undergoing initial average risk screening colonoscopy.  He has no family history of colon cancer and no chronic GI symptoms other than irregular bowel habits and long standing occasional bright red blood per rectum attributed to hemorrhoids.    Past Medical History:  Diagnosis Date   Anxiety    Environmental allergies    GERD (gastroesophageal reflux disease)    Hay fever    HTN (hypertension), benign    Hyperlipidemia     Past Surgical History:  Procedure Laterality Date   Unremarkable      Prior to Admission medications   Medication Sig Start Date End Date Taking? Authorizing Provider  atorvastatin (LIPITOR) 20 MG tablet Take 1 tablet (20 mg total) by mouth daily. 11/09/14  Yes Waldon Merl, PA-C  bisoprolol (ZEBETA) 5 MG tablet Take 1 tablet by mouth daily. 09/19/20  Yes [provider]  clonazePAM (KLONOPIN) 1 MG tablet Take 1 mg by mouth 4 (four) times daily as needed for anxiety. 08/29/20  Yes [provider]  diphenhydrAMINE (BENADRYL) 25 MG tablet Take 1 tablet (25 mg total) by mouth every 8 (eight) hours as needed for allergies. Patient taking differently: Take 25 mg by mouth daily. 03/30/20  Yes Benjiman Core, MD  fluticasone (FLONASE) 50 MCG/ACT nasal spray Place 2 sprays into both nostrils daily. 04/03/23  Yes [provider]  acetaminophen (TYLENOL) 500 MG tablet Take 1,000 mg by mouth every 6 (six) hours as needed for mild pain or headache.     [provider]  citalopram (CELEXA) 40 MG tablet Take 0.5 tablets (20 mg total) by mouth 2 (two) times daily. Patient taking differently: Take 40 mg by mouth daily. 11/01/14   Waldon Merl, PA-C  dexlansoprazole (DEXILANT) 60 MG capsule Take 1 capsule by mouth  daily.    [provider]    Current Outpatient Medications  Medication Sig Dispense Refill   atorvastatin (LIPITOR) 20 MG tablet Take 1 tablet (20 mg total) by mouth daily. 30 tablet 6   bisoprolol (ZEBETA) 5 MG tablet Take 1 tablet by mouth daily.     clonazePAM (KLONOPIN) 1 MG tablet Take 1 mg by mouth 4 (four) times daily as needed for anxiety.     diphenhydrAMINE (BENADRYL) 25 MG tablet Take 1 tablet (25 mg total) by mouth every 8 (eight) hours as needed for allergies. (Patient taking differently: Take 25 mg by mouth daily.) 20 tablet 0   fluticasone (FLONASE) 50 MCG/ACT nasal spray Place 2 sprays into both nostrils daily.     acetaminophen (TYLENOL) 500 MG tablet Take 1,000 mg by mouth every 6 (six) hours as needed for mild pain or headache.      citalopram (CELEXA) 40 MG tablet Take 0.5 tablets (20 mg total) by mouth 2 (two) times daily. (Patient taking differently: Take 40 mg by mouth daily.) 60 tablet 1   dexlansoprazole (DEXILANT) 60 MG capsule Take 1 capsule by mouth daily.     Current Facility-Administered Medications  Medication Dose Route Frequency Provider Last Rate Last Admin   0.9 %  sodium chloride infusion  500 mL Intravenous Once Jenel Lucks, MD        Allergies as of 05/27/2023 - Review Complete 05/27/2023  Allergen Reaction  Noted   Influenza vaccines Anaphylaxis 10/17/2014   Iodine Anaphylaxis 09/05/2020   Penicillins  10/17/2014    Family History  Problem Relation Age of Onset   Arthritis Mother        Living   Hypertension Mother    Hypertension Father    Diabetes Father    Colon cancer Father 23       Deceased   Heart disease Other        Grandparent   Stroke Other        Grandparent   Prostate cancer Other        Uncle   Sudden death Other        Aunt & Uncle    Social History   Socioeconomic History   Marital status: Single    Spouse name: Not on file   Number of children: Not on file   Years of education: Not on file    Highest education level: Not on file  Occupational History   Not on file  Tobacco Use   Smoking status: Former    Types: Cigarettes   Smokeless tobacco: Never  Substance and Sexual Activity   Alcohol use: Yes    Alcohol/week: 6.0 standard drinks of alcohol    Types: 6 Cans of beer per week   Drug use: Not Currently   Sexual activity: Not on file  Other Topics Concern   Not on file  Social History Narrative   Not on file   Social Determinants of Health   Financial Resource Strain: Not on file  Food Insecurity: Not on file  Transportation Needs: Not on file  Physical Activity: Not on file  Stress: Not on file  Social Connections: Not on file  Intimate Partner Violence: Not on file    Review of Systems:  All other review of systems negative except as mentioned in the HPI.  Physical Exam: Vital signs BP (!) 165/98   Pulse 88   Temp 98.6 F (37 C)   Ht 5\' 8"  (1.727 m)   Wt 283 lb (128.4 kg)   SpO2 97%   BMI 43.03 kg/m   General:   Alert,  Well-developed, well-nourished, pleasant and cooperative in NAD Airway:  Mallampati 3 Lungs:  Clear throughout to auscultation.   Heart:  Regular rate and rhythm; no murmurs, clicks, rubs,  or gallops. Abdomen:  Soft, nontender and nondistended. Normal bowel sounds.   Neuro/Psych:  Normal mood and affect. A and O x 3   Darria Corvera E. Tomasa Rand, MD [facility]

## 2023-05-27 NOTE — Progress Notes (Signed)
Report to PACU, RN, vss, BBS= Clear.  

## 2023-05-27 NOTE — Op Note (Signed)
[facility] Patient Name: Trevor Norris Procedure Date: 05/27/2023 11:45 AM MRN: [id number] Endoscopist: Lorin Picket E. Tomasa Rand , MD, [id number] Age: 57 Referring MD:  Date of Birth: [birth year] Gender: Male Account #: [id number] Procedure:                Colonoscopy Indications:              Screening for colorectal malignant neoplasm, This                            is the patient's first colonoscopy Medicines:                Monitored Anesthesia Care Procedure:                Pre-Anesthesia Assessment:                           - Prior to the procedure, a History and Physical                            was performed, and patient medications and                            allergies were reviewed. The patient's tolerance of                            previous anesthesia was also reviewed. The risks                            and benefits of the procedure and the sedation                            options and risks were discussed with the patient.                            All questions were answered, and informed consent                            was obtained. Prior Anticoagulants: The patient has                            taken no anticoagulant or antiplatelet agents. ASA                            Grade Assessment: III - A patient with severe                            systemic disease. After reviewing the risks and                            benefits, the patient was deemed in satisfactory                            condition to undergo the procedure.  After obtaining informed consent, the colonoscope                            was passed under direct vision. Throughout the                            procedure, the patient's blood pressure, pulse, and                            oxygen saturations were monitored continuously. The                            Olympus CF-HQ190L ([id number]) Colonoscope was                            introduced through  the anus and advanced to the the                            cecum, identified by appendiceal orifice and                            ileocecal valve. The colonoscopy was somewhat                            difficult due to significant looping. The patient                            tolerated the procedure well. The quality of the                            bowel preparation was adequate. The ileocecal                            valve, appendiceal orifice, and rectum were                            photographed. The bowel preparation used was SUPREP                            via split dose instruction. Scope In: 11:54:31 AM Scope Out: 12:18:58 PM Scope Withdrawal Time: 0 hours 20 minutes 55 seconds  Total Procedure Duration: 0 hours 24 minutes 27 seconds  Findings:                 The perianal and digital rectal examinations were                            normal. Pertinent negatives include normal                            sphincter tone and no palpable rectal lesions.                           A 3 mm polyp was found in the cecum. The polyp was  sessile. The polyp was removed with a cold snare.                            Resection and retrieval were complete. Estimated                            blood loss was minimal.                           Three sessile polyps were found in the ascending                            colon. The polyps were 3 to 4 mm in size. These                            polyps were removed with a cold snare. Resection                            and retrieval were complete. Estimated blood loss                            was minimal.                           Four sessile polyps were found in the transverse                            colon. The polyps were 3 to 6 mm in size. These                            polyps were removed with a cold snare. Resection                            and retrieval were complete. Estimated blood loss                             was minimal.                           Multiple medium-mouthed and small-mouthed                            diverticula were found in the sigmoid colon,                            descending colon and transverse colon. There was no                            evidence of diverticular bleeding.                           The exam was otherwise normal throughout the  examined colon.                           The retroflexed view of the distal rectum and anal                            verge was normal and showed no anal or rectal                            abnormalities. Complications:            No immediate complications. Estimated Blood Loss:     Estimated blood loss was minimal. Impression:               - One 3 mm polyp in the cecum, removed with a cold                            snare. Resected and retrieved.                           - Three 3 to 4 mm polyps in the ascending colon,                            removed with a cold snare. Resected and retrieved.                           - Four 3 to 6 mm polyps in the transverse colon,                            removed with a cold snare. Resected and retrieved.                           - Mild diverticulosis in the sigmoid colon, in the                            descending colon and in the transverse colon. There                            was no evidence of diverticular bleeding.                           - The distal rectum and anal verge are normal on                            retroflexion view. Recommendation:           - Patient has a contact number available for                            emergencies. The signs and symptoms of potential                            delayed complications were discussed with the  patient. Return to normal activities tomorrow.                            Written discharge instructions were provided to the                             patient.                           - Resume previous diet.                           - Continue present medications.                           - Await pathology results.                           - Repeat colonoscopy (date not yet determined) for                            surveillance based on pathology results. Bhavik Cabiness E. Tomasa Rand, MD 05/27/2023 12:25:01 PM This report has been signed electronically.

## 2023-05-28 ENCOUNTER — Telehealth: Payer: Self-pay | Admitting: *Deleted

## 2023-05-28 NOTE — Telephone Encounter (Signed)
Attempted to call patient for their post-procedure follow-up call. No answer. Left voicemail.   

## 2023-06-09 ENCOUNTER — Encounter: Payer: Self-pay | Admitting: Gastroenterology

## 2023-08-26 DIAGNOSIS — I10 Essential (primary) hypertension: Secondary | ICD-10-CM | POA: Diagnosis not present

## 2023-08-26 DIAGNOSIS — R7303 Prediabetes: Secondary | ICD-10-CM | POA: Diagnosis not present

## 2023-10-01 DIAGNOSIS — R7303 Prediabetes: Secondary | ICD-10-CM | POA: Diagnosis not present

## 2023-10-01 DIAGNOSIS — R748 Abnormal levels of other serum enzymes: Secondary | ICD-10-CM | POA: Diagnosis not present

## 2023-10-01 DIAGNOSIS — E7800 Pure hypercholesterolemia, unspecified: Secondary | ICD-10-CM | POA: Diagnosis not present

## 2023-10-01 DIAGNOSIS — I10 Essential (primary) hypertension: Secondary | ICD-10-CM | POA: Diagnosis not present

## 2023-10-22 DIAGNOSIS — I10 Essential (primary) hypertension: Secondary | ICD-10-CM | POA: Diagnosis not present

## 2023-11-12 NOTE — Telephone Encounter (Signed)
Error

## 2023-11-17 DIAGNOSIS — L989 Disorder of the skin and subcutaneous tissue, unspecified: Secondary | ICD-10-CM | POA: Diagnosis not present

## 2023-11-17 DIAGNOSIS — R350 Frequency of micturition: Secondary | ICD-10-CM | POA: Diagnosis not present

## 2023-11-17 DIAGNOSIS — R1030 Lower abdominal pain, unspecified: Secondary | ICD-10-CM | POA: Diagnosis not present

## 2024-01-20 DIAGNOSIS — R0982 Postnasal drip: Secondary | ICD-10-CM | POA: Diagnosis not present

## 2024-03-09 DIAGNOSIS — B356 Tinea cruris: Secondary | ICD-10-CM | POA: Diagnosis not present

## 2024-03-29 DIAGNOSIS — E7800 Pure hypercholesterolemia, unspecified: Secondary | ICD-10-CM | POA: Diagnosis not present

## 2024-03-29 DIAGNOSIS — I10 Essential (primary) hypertension: Secondary | ICD-10-CM | POA: Diagnosis not present

## 2024-05-04 DIAGNOSIS — K649 Unspecified hemorrhoids: Secondary | ICD-10-CM | POA: Diagnosis not present

## 2024-10-11 DIAGNOSIS — I10 Essential (primary) hypertension: Secondary | ICD-10-CM | POA: Diagnosis not present

## 2024-10-11 DIAGNOSIS — K219 Gastro-esophageal reflux disease without esophagitis: Secondary | ICD-10-CM | POA: Diagnosis not present

## 2024-10-11 DIAGNOSIS — E7800 Pure hypercholesterolemia, unspecified: Secondary | ICD-10-CM | POA: Diagnosis not present

## 2024-10-11 DIAGNOSIS — R7303 Prediabetes: Secondary | ICD-10-CM | POA: Diagnosis not present

## 2024-10-11 DIAGNOSIS — Z0000 Encounter for general adult medical examination without abnormal findings: Secondary | ICD-10-CM | POA: Diagnosis not present
